# Patient Record
Sex: Female | Born: 1980 | Race: Black or African American | Hispanic: No | Marital: Single | Smoking: Never smoker
Health system: Southern US, Community
[De-identification: ages and names within clinical notes are randomized; demographics above are authoritative.]

## PROBLEM LIST (undated history)

## (undated) DIAGNOSIS — M1990 Unspecified osteoarthritis, unspecified site: Secondary | ICD-10-CM

## (undated) DIAGNOSIS — K219 Gastro-esophageal reflux disease without esophagitis: Secondary | ICD-10-CM

## (undated) DIAGNOSIS — R011 Cardiac murmur, unspecified: Secondary | ICD-10-CM

## (undated) DIAGNOSIS — R51 Headache: Secondary | ICD-10-CM

## (undated) HISTORY — PX: DILATION AND CURETTAGE OF UTERUS: SHX78

## (undated) HISTORY — PX: TUBAL LIGATION: SHX77

---

## 2012-03-21 ENCOUNTER — Encounter (HOSPITAL_COMMUNITY): Payer: Self-pay | Admitting: Pharmacy Technician

## 2012-03-21 ENCOUNTER — Other Ambulatory Visit (HOSPITAL_COMMUNITY): Payer: Self-pay | Admitting: Orthopedic Surgery

## 2012-03-21 ENCOUNTER — Encounter (HOSPITAL_COMMUNITY): Payer: Self-pay

## 2012-03-22 NOTE — H&P (Signed)
Theresa Powell is an 32 y.o. female.   Chief Complaint: Left ankle medial malleolar fracture. HPI: Patient jumped out of a tree sustained a medial malleolar fracture interarticular left ankle  Past Medical History  Diagnosis Date  . Heart murmur     "as a child"  . GERD (gastroesophageal reflux disease)   . Headache     "migraines"  . Arthritis     Past Surgical History  Procedure Laterality Date  . Dilation and curettage of uterus    . Tubal ligation      History reviewed. No pertinent family history. Social History:  reports that she has never smoked. She does not have any smokeless tobacco history on file. She reports that she does not use illicit drugs. Her alcohol history is not on file.  Allergies:  Allergies  Allergen Reactions  . Hydrocodone-Acetaminophen Nausea Only and Other (See Comments)    Made patient dizzy/light headed after taken.    No prescriptions prior to admission    No results found for this or any previous visit (from the past 48 hour(s)). No results found.  Review of Systems  All other systems reviewed and are negative.    Height 5\' 6"  (1.676 m), weight 79.379 kg (175 lb), last menstrual period 03/08/2012. Physical Exam  On examination patient has palpable pulses she is tender to palpation medially there is no skin abrasions or breakdown. Radiographs shows a displaced intra-articular medial malleolar fracture. Assessment/Plan Assessment intra-articular medial malleolar displaced fracture.  Plan: Will plan for internal fixation medial malleolar fracture. Risks and benefits were discussed including infection arthritis neurovascular injury nonhealing of the bone. Patient states he understands was pursued this time plan to followup 2 weeks in the office.  DUDA,MARCUS V 03/22/2012, 7:21 AM

## 2012-03-24 MED ORDER — CEFAZOLIN SODIUM-DEXTROSE 2-3 GM-% IV SOLR
2.0000 g | INTRAVENOUS | Status: AC
  Administered 2012-03-25: 2 g via INTRAVENOUS
  Filled 2012-03-24: qty 50

## 2012-03-25 ENCOUNTER — Ambulatory Visit (HOSPITAL_COMMUNITY)
Admission: RE | Admit: 2012-03-25 | Discharge: 2012-03-25 | Disposition: A | Discharge status: Home / Self Care | Payer: BC Managed Care – PPO | Source: Ambulatory Visit | Attending: Orthopedic Surgery | Admitting: Orthopedic Surgery

## 2012-03-25 ENCOUNTER — Encounter (HOSPITAL_COMMUNITY)
Admission: RE | Disposition: A | Discharge status: Home / Self Care | Payer: Self-pay | Source: Ambulatory Visit | Attending: Orthopedic Surgery

## 2012-03-25 ENCOUNTER — Encounter (HOSPITAL_COMMUNITY): Payer: Self-pay | Admitting: Anesthesiology

## 2012-03-25 ENCOUNTER — Ambulatory Visit (HOSPITAL_COMMUNITY): Payer: BC Managed Care – PPO | Admitting: Anesthesiology

## 2012-03-25 DIAGNOSIS — S8253XA Displaced fracture of medial malleolus of unspecified tibia, initial encounter for closed fracture: Secondary | ICD-10-CM | POA: Insufficient documentation

## 2012-03-25 DIAGNOSIS — S82872A Displaced pilon fracture of left tibia, initial encounter for closed fracture: Secondary | ICD-10-CM

## 2012-03-25 DIAGNOSIS — W1789XA Other fall from one level to another, initial encounter: Secondary | ICD-10-CM | POA: Insufficient documentation

## 2012-03-25 HISTORY — DX: Headache: R51

## 2012-03-25 HISTORY — DX: Gastro-esophageal reflux disease without esophagitis: K21.9

## 2012-03-25 HISTORY — PX: ORIF ANKLE FRACTURE: SHX5408

## 2012-03-25 HISTORY — DX: Cardiac murmur, unspecified: R01.1

## 2012-03-25 HISTORY — DX: Unspecified osteoarthritis, unspecified site: M19.90

## 2012-03-25 LAB — CBC
HCT: 40.4 % (ref 36.0–46.0)
Hemoglobin: 13.8 g/dL (ref 12.0–15.0)
MCV: 80.2 fL (ref 78.0–100.0)
RBC: 5.04 MIL/uL (ref 3.87–5.11)
WBC: 6.1 10*3/uL (ref 4.0–10.5)

## 2012-03-25 SURGERY — OPEN REDUCTION INTERNAL FIXATION (ORIF) ANKLE FRACTURE
Anesthesia: General | Site: Ankle | Laterality: Left | Wound class: Clean

## 2012-03-25 MED ORDER — HYDROMORPHONE HCL PF 1 MG/ML IJ SOLN
0.2500 mg | INTRAMUSCULAR | Status: DC | PRN
  Administered 2012-03-25 (×2): 0.5 mg via INTRAVENOUS

## 2012-03-25 MED ORDER — DIPHENHYDRAMINE HCL 50 MG/ML IJ SOLN
12.5000 mg | Freq: Once | INTRAMUSCULAR | Status: AC
  Administered 2012-03-25: 12.5 mg via INTRAVENOUS

## 2012-03-25 MED ORDER — DEXAMETHASONE SODIUM PHOSPHATE 4 MG/ML IJ SOLN
INTRAMUSCULAR | Status: DC | PRN
  Administered 2012-03-25: 4 mg via INTRAVENOUS

## 2012-03-25 MED ORDER — OXYCODONE HCL 5 MG/5ML PO SOLN: 5.0000 mg | Freq: Once | ORAL | Status: AC | PRN

## 2012-03-25 MED ORDER — TRAMADOL HCL 50 MG PO TABS: 50.0000 mg | ORAL_TABLET | Freq: Four times a day (QID) | ORAL | Status: DC | PRN

## 2012-03-25 MED ORDER — OXYCODONE HCL 5 MG PO TABS
ORAL_TABLET | ORAL | Status: AC
  Administered 2012-03-25: 5 mg via ORAL
  Filled 2012-03-25: qty 1

## 2012-03-25 MED ORDER — MUPIROCIN 2 % EX OINT: TOPICAL_OINTMENT | Freq: Two times a day (BID) | CUTANEOUS | Status: DC

## 2012-03-25 MED ORDER — LACTATED RINGERS IV SOLN
INTRAVENOUS | Status: DC | PRN
  Administered 2012-03-25: 17:00:00 via INTRAVENOUS

## 2012-03-25 MED ORDER — FENTANYL CITRATE 0.05 MG/ML IJ SOLN
INTRAMUSCULAR | Status: DC | PRN
  Administered 2012-03-25 (×5): 50 ug via INTRAVENOUS

## 2012-03-25 MED ORDER — 0.9 % SODIUM CHLORIDE (POUR BTL) OPTIME
TOPICAL | Status: DC | PRN
  Administered 2012-03-25: 1000 mL

## 2012-03-25 MED ORDER — MIDAZOLAM HCL 5 MG/5ML IJ SOLN
INTRAMUSCULAR | Status: DC | PRN
  Administered 2012-03-25: 2 mg via INTRAVENOUS

## 2012-03-25 MED ORDER — HYDROMORPHONE HCL PF 1 MG/ML IJ SOLN
INTRAMUSCULAR | Status: AC
  Administered 2012-03-25: 0.5 mg via INTRAVENOUS
  Filled 2012-03-25: qty 1

## 2012-03-25 MED ORDER — METOCLOPRAMIDE HCL 5 MG/ML IJ SOLN
INTRAMUSCULAR | Status: AC
  Administered 2012-03-25: 10 mg via INTRAVENOUS
  Filled 2012-03-25: qty 2

## 2012-03-25 MED ORDER — ACETAMINOPHEN 10 MG/ML IV SOLN
INTRAVENOUS | Status: DC | PRN
  Administered 2012-03-25: 1000 mg via INTRAVENOUS

## 2012-03-25 MED ORDER — DIPHENHYDRAMINE HCL 50 MG/ML IJ SOLN
INTRAMUSCULAR | Status: AC
  Filled 2012-03-25: qty 1

## 2012-03-25 MED ORDER — OXYCODONE HCL 5 MG PO TABS: 5.0000 mg | ORAL_TABLET | Freq: Once | ORAL | Status: AC | PRN

## 2012-03-25 MED ORDER — MUPIROCIN 2 % EX OINT
TOPICAL_OINTMENT | CUTANEOUS | Status: AC
  Administered 2012-03-25: 1 via NASAL
  Filled 2012-03-25: qty 22

## 2012-03-25 MED ORDER — PROPOFOL 10 MG/ML IV BOLUS
INTRAVENOUS | Status: DC | PRN
  Administered 2012-03-25: 200 mg via INTRAVENOUS

## 2012-03-25 MED ORDER — HYDROMORPHONE HCL PF 1 MG/ML IJ SOLN
INTRAMUSCULAR | Status: AC
  Filled 2012-03-25: qty 1

## 2012-03-25 MED ORDER — METOCLOPRAMIDE HCL 5 MG/ML IJ SOLN: 10.0000 mg | Freq: Once | INTRAMUSCULAR | Status: AC | PRN

## 2012-03-25 MED ORDER — LIDOCAINE HCL (CARDIAC) 20 MG/ML IV SOLN
INTRAVENOUS | Status: DC | PRN
  Administered 2012-03-25: 100 mg via INTRAVENOUS

## 2012-03-25 MED ORDER — ONDANSETRON HCL 4 MG/2ML IJ SOLN
INTRAMUSCULAR | Status: DC | PRN
  Administered 2012-03-25: 4 mg via INTRAVENOUS

## 2012-03-25 SURGICAL SUPPLY — 47 items
BANDAGE ESMARK 6X9 LF (GAUZE/BANDAGES/DRESSINGS) IMPLANT
BANDAGE GAUZE ELAST BULKY 4 IN (GAUZE/BANDAGES/DRESSINGS) ×2 IMPLANT
BIT DRILL 2.9 CANN QC NONSTRL (BIT) ×2 IMPLANT
BNDG COHESIVE 4X5 TAN STRL (GAUZE/BANDAGES/DRESSINGS) ×2 IMPLANT
BNDG COHESIVE 6X5 TAN STRL LF (GAUZE/BANDAGES/DRESSINGS) ×2 IMPLANT
BNDG ESMARK 6X9 LF (GAUZE/BANDAGES/DRESSINGS)
BNDG GAUZE STRTCH 6 (GAUZE/BANDAGES/DRESSINGS) ×2 IMPLANT
CLOTH BEACON ORANGE TIMEOUT ST (SAFETY) ×2 IMPLANT
COVER SURGICAL LIGHT HANDLE (MISCELLANEOUS) ×2 IMPLANT
CUFF TOURNIQUET SINGLE 34IN LL (TOURNIQUET CUFF) IMPLANT
CUFF TOURNIQUET SINGLE 44IN (TOURNIQUET CUFF) IMPLANT
DRAPE C-ARM MINI 42X72 WSTRAPS (DRAPES) ×2 IMPLANT
DRAPE INCISE IOBAN 66X45 STRL (DRAPES) IMPLANT
DRAPE PROXIMA HALF (DRAPES) ×2 IMPLANT
DRAPE U-SHAPE 47X51 STRL (DRAPES) ×2 IMPLANT
DRSG ADAPTIC 3X8 NADH LF (GAUZE/BANDAGES/DRESSINGS) ×2 IMPLANT
DURAPREP 26ML APPLICATOR (WOUND CARE) ×2 IMPLANT
ELECT REM PT RETURN 9FT ADLT (ELECTROSURGICAL) ×2
ELECTRODE REM PT RTRN 9FT ADLT (ELECTROSURGICAL) ×1 IMPLANT
GLOVE BIO SURGEON STRL SZ7.5 (GLOVE) ×2 IMPLANT
GLOVE BIOGEL PI IND STRL 9 (GLOVE) ×1 IMPLANT
GLOVE BIOGEL PI INDICATOR 9 (GLOVE) ×1
GLOVE SURG ORTHO 9.0 STRL STRW (GLOVE) ×2 IMPLANT
GOWN PREVENTION PLUS XLARGE (GOWN DISPOSABLE) ×2 IMPLANT
GOWN SRG XL XLNG 56XLVL 4 (GOWN DISPOSABLE) ×2 IMPLANT
GOWN STRL NON-REIN XL XLG LVL4 (GOWN DISPOSABLE) ×2
K-WIRE ACE 1.6X6 (WIRE) ×4
KIT BASIN OR (CUSTOM PROCEDURE TRAY) ×2 IMPLANT
KIT ROOM TURNOVER OR (KITS) ×2 IMPLANT
KWIRE ACE 1.6X6 (WIRE) ×2 IMPLANT
MANIFOLD NEPTUNE II (INSTRUMENTS) ×2 IMPLANT
NS IRRIG 1000ML POUR BTL (IV SOLUTION) ×2 IMPLANT
PACK ORTHO EXTREMITY (CUSTOM PROCEDURE TRAY) ×2 IMPLANT
PAD ARMBOARD 7.5X6 YLW CONV (MISCELLANEOUS) ×4 IMPLANT
PADDING CAST COTTON 6X4 STRL (CAST SUPPLIES) ×2 IMPLANT
SCREW ACE CAN 4.0 42M (Screw) ×4 IMPLANT
SPONGE GAUZE 4X4 12PLY (GAUZE/BANDAGES/DRESSINGS) ×2 IMPLANT
SPONGE LAP 18X18 X RAY DECT (DISPOSABLE) ×2 IMPLANT
STAPLER VISISTAT 35W (STAPLE) ×2 IMPLANT
SUCTION FRAZIER TIP 10 FR DISP (SUCTIONS) ×2 IMPLANT
SUT ETHILON 2 0 PSLX (SUTURE) IMPLANT
SUT VIC AB 2-0 CTB1 (SUTURE) ×6 IMPLANT
TAPE STRIPS DRAPE STRL (GAUZE/BANDAGES/DRESSINGS) ×2 IMPLANT
TOWEL OR 17X24 6PK STRL BLUE (TOWEL DISPOSABLE) ×2 IMPLANT
TOWEL OR 17X26 10 PK STRL BLUE (TOWEL DISPOSABLE) ×2 IMPLANT
TUBE CONNECTING 12X1/4 (SUCTIONS) ×2 IMPLANT
WATER STERILE IRR 1000ML POUR (IV SOLUTION) ×2 IMPLANT

## 2012-03-25 NOTE — Transfer of Care (Signed)
Immediate Anesthesia Transfer of Care Note  Patient: Theresa Powell  Procedure(s) Performed: Procedure(s) with comments: OPEN REDUCTION INTERNAL FIXATION (ORIF) ANKLE FRACTURE (Left) - Open Reduction Internal Fixation Left Ankle  Patient Location: PACU  Anesthesia Type:General  Level of Consciousness: awake, alert  and oriented  Airway & Oxygen Therapy: Patient Spontanous Breathing and Patient connected to nasal cannula oxygen  Post-op Assessment: Report given to PACU RN and Post -op Vital signs reviewed and stable  Post vital signs: Reviewed and stable  Complications: No apparent anesthesia complications

## 2012-03-25 NOTE — Preoperative (Signed)
Beta Blockers   Reason not to administer Beta Blockers:Not Applicable 

## 2012-03-25 NOTE — Progress Notes (Signed)
Orthopedic Tech Progress Note Patient Details:  Theresa Powell 31-Oct-1980 [id number]  Ortho Devices Type of Ortho Device: CAM walker Ortho Device/Splint Location: left foot Ortho Device/Splint Interventions: Application   Bear Osten 03/25/2012, 8:50 PM

## 2012-03-25 NOTE — Anesthesia Postprocedure Evaluation (Signed)
  Anesthesia Post-op Note  Patient: Theresa Powell  Procedure(s) Performed: Procedure(s) with comments: OPEN REDUCTION INTERNAL FIXATION (ORIF) ANKLE FRACTURE (Left) - Open Reduction Internal Fixation Left Ankle  Patient Location: PACU  Anesthesia Type:General  Level of Consciousness: awake, alert  and oriented  Airway and Oxygen Therapy: Patient Spontanous Breathing and Patient connected to nasal cannula oxygen  Post-op Pain: mild  Post-op Assessment: Post-op Vital signs reviewed and Patient's Cardiovascular Status Stable  Post-op Vital Signs: stable  Complications: No apparent anesthesia complications

## 2012-03-25 NOTE — Op Note (Signed)
OPERATIVE REPORT  DATE OF SURGERY: 03/25/2012  PATIENT:  Theresa Powell,  32 y.o. female  PRE-OPERATIVE DIAGNOSIS:  Intra-articular left medial malleolus pylon fracture  POST-OPERATIVE DIAGNOSIS:  Intra-articular left medial malleolus pylon fracture  PROCEDURE:  Procedure(s): OPEN REDUCTION INTERNAL FIXATION (ORIF) pylon ANKLE FRACTURE  SURGEON:  Surgeon(s): Nadara Mustard, MD  ANESTHESIA:   general  EBL:  Minimal ML  SPECIMEN:  No Specimen  TOURNIQUET:  * No tourniquets in log *  PROCEDURE DETAILS: Patient is a 32 year old woman who jumped out of a tree sustaining an intra-articular medial malleolar pilon fracture. Patient has an unstable fracture and presents at this time for open reduction internal fixation. Risks and benefits were discussed including infection neurovascular injury arthritis need for additional surgery. Patient states she understands and wished to proceed at this time. Description of procedure patient brought to the operating room and underwent a general anesthetic. After adequate levels and anesthesia were obtained patient's left lower extremity was prepped using DuraPrep and draped into a sterile field. An incision was made longitudinally over the medial malleolus. The fracture was freshened irrigated cleansed and reduced. This was stabilized with 2 K wires and then using the drill followed by the cannulated screws 2 cannulated screws 44 mm in length were used to stabilize the fracture. C-arm fluoroscopy was used to verify reduction. 2 view radiographs of the ankle shows a congruent mortise after reduction internal fixation. The wound was irrigated normal saline subcutaneous is closed using 2-0 Vicryl the skin was closed using staples. Wound was covered with Adaptic orthopedic sponges Kerlix and Coban. Patient was extubated taken to the PACU in stable condition plan for discharge to home.  PLAN OF CARE: Discharge to home after PACU  PATIENT DISPOSITION:  PACU -  hemodynamically stable.   Nadara Mustard, MD 03/25/2012 6:22 PM

## 2012-03-25 NOTE — H&P (Signed)
  Patient's procedure was canceled Friday do to snow and ice.. She presents at this time for open reduction internal fixation medial malleolar fracture. There is no interval change since her last history and physical on Friday.

## 2012-03-25 NOTE — Anesthesia Preprocedure Evaluation (Signed)
Anesthesia Evaluation  Patient identified by MRN, date of birth, ID band Patient awake    Reviewed: Allergy & Precautions, H&P , NPO status , Patient's Chart, lab work & pertinent test results, reviewed documented beta blocker date and time   Airway Mallampati: II TM Distance: >3 FB Neck ROM: full    Dental   Pulmonary neg pulmonary ROS,  breath sounds clear to auscultation        Cardiovascular negative cardio ROS  Rhythm:regular     Neuro/Psych  Headaches, negative psych ROS   GI/Hepatic negative GI ROS, Neg liver ROS, GERD-  Medicated and Controlled,  Endo/Other  negative endocrine ROS  Renal/GU negative Renal ROS  negative genitourinary   Musculoskeletal   Abdominal   Peds  Hematology negative hematology ROS (+)   Anesthesia Other Findings See surgeon's H&P   Reproductive/Obstetrics negative OB ROS                           Anesthesia Physical Anesthesia Plan  ASA: I  Anesthesia Plan: General   Post-op Pain Management:    Induction: Intravenous  Airway Management Planned: LMA  Additional Equipment:   Intra-op Plan:   Post-operative Plan:   Informed Consent: I have reviewed the patients History and Physical, chart, labs and discussed the procedure including the risks, benefits and alternatives for the proposed anesthesia with the patient or authorized representative who has indicated his/her understanding and acceptance.   Dental Advisory Given  Plan Discussed with: CRNA and Surgeon  Anesthesia Plan Comments:         Anesthesia Quick Evaluation

## 2012-03-25 NOTE — OR Nursing (Signed)
Placed lead apron around patient's abdomen prior to procedure.

## 2012-03-25 NOTE — Anesthesia Procedure Notes (Signed)
Procedure Name: LMA Insertion Date/Time: 03/25/2012 5:50 PM Performed by: Brien Mates DOBSON Pre-anesthesia Checklist: Patient identified, Emergency Drugs available, Suction available, Patient being monitored and Timeout performed Patient Re-evaluated:Patient Re-evaluated prior to inductionOxygen Delivery Method: Circle system utilized Preoxygenation: Pre-oxygenation with 100% oxygen Intubation Type: IV induction Ventilation: Mask ventilation without difficulty LMA: LMA inserted and LMA with gastric port inserted LMA Size: 4.0 Placement Confirmation: positive ETCO2 and breath sounds checked- equal and bilateral Tube secured with: Tape Dental Injury: Teeth and Oropharynx as per pre-operative assessment

## 2012-03-27 ENCOUNTER — Encounter (HOSPITAL_COMMUNITY): Payer: Self-pay | Admitting: Orthopedic Surgery

## 2014-02-23 DIAGNOSIS — F331 Major depressive disorder, recurrent, moderate: Secondary | ICD-10-CM | POA: Insufficient documentation

## 2014-02-23 DIAGNOSIS — G43009 Migraine without aura, not intractable, without status migrainosus: Secondary | ICD-10-CM | POA: Insufficient documentation

## 2017-02-22 ENCOUNTER — Other Ambulatory Visit (HOSPITAL_COMMUNITY): Payer: Self-pay | Admitting: Family Medicine

## 2017-02-23 ENCOUNTER — Other Ambulatory Visit: Payer: Self-pay | Admitting: Family Medicine

## 2017-02-23 DIAGNOSIS — R1084 Generalized abdominal pain: Secondary | ICD-10-CM

## 2017-02-23 DIAGNOSIS — R102 Pelvic and perineal pain: Secondary | ICD-10-CM

## 2017-02-27 ENCOUNTER — Ambulatory Visit
Admission: RE | Admit: 2017-02-27 | Discharge: 2017-02-27 | Disposition: A | Discharge status: Home / Self Care | Payer: BLUE CROSS/BLUE SHIELD | Source: Ambulatory Visit | Attending: Family Medicine | Admitting: Family Medicine

## 2017-02-27 ENCOUNTER — Other Ambulatory Visit: Payer: Self-pay | Admitting: Family Medicine

## 2017-02-27 DIAGNOSIS — R102 Pelvic and perineal pain: Secondary | ICD-10-CM

## 2017-02-27 DIAGNOSIS — R1084 Generalized abdominal pain: Secondary | ICD-10-CM | POA: Diagnosis not present

## 2017-03-12 ENCOUNTER — Other Ambulatory Visit: Payer: Self-pay | Admitting: Nurse Practitioner

## 2017-03-12 DIAGNOSIS — G932 Benign intracranial hypertension: Secondary | ICD-10-CM

## 2017-03-20 ENCOUNTER — Ambulatory Visit: Payer: BLUE CROSS/BLUE SHIELD

## 2017-03-21 ENCOUNTER — Ambulatory Visit
Admission: RE | Admit: 2017-03-21 | Discharge: 2017-03-21 | Disposition: A | Discharge status: Home / Self Care | Payer: BLUE CROSS/BLUE SHIELD | Source: Ambulatory Visit | Attending: Nurse Practitioner | Admitting: Nurse Practitioner

## 2017-03-21 DIAGNOSIS — G932 Benign intracranial hypertension: Secondary | ICD-10-CM

## 2017-03-21 DIAGNOSIS — R9089 Other abnormal findings on diagnostic imaging of central nervous system: Secondary | ICD-10-CM | POA: Insufficient documentation

## 2017-03-27 ENCOUNTER — Other Ambulatory Visit
Admission: RE | Admit: 2017-03-27 | Discharge: 2017-03-27 | Disposition: A | Discharge status: Home / Self Care | Payer: BLUE CROSS/BLUE SHIELD | Source: Other Acute Inpatient Hospital | Attending: Neurology | Admitting: Neurology

## 2017-03-27 DIAGNOSIS — R9089 Other abnormal findings on diagnostic imaging of central nervous system: Secondary | ICD-10-CM | POA: Diagnosis present

## 2017-03-27 LAB — APTT: aPTT: 33 seconds (ref 24–36)

## 2017-04-04 ENCOUNTER — Other Ambulatory Visit: Payer: Self-pay | Admitting: Neurology

## 2017-04-06 ENCOUNTER — Other Ambulatory Visit: Payer: Self-pay | Admitting: Neurology

## 2017-04-06 DIAGNOSIS — G932 Benign intracranial hypertension: Secondary | ICD-10-CM

## 2017-04-18 ENCOUNTER — Ambulatory Visit
Admission: RE | Admit: 2017-04-18 | Discharge: 2017-04-18 | Disposition: A | Discharge status: Home / Self Care | Payer: BLUE CROSS/BLUE SHIELD | Source: Ambulatory Visit | Attending: Neurology | Admitting: Neurology

## 2017-04-18 DIAGNOSIS — G43009 Migraine without aura, not intractable, without status migrainosus: Secondary | ICD-10-CM | POA: Insufficient documentation

## 2017-04-18 DIAGNOSIS — G932 Benign intracranial hypertension: Secondary | ICD-10-CM

## 2017-04-18 LAB — GLUCOSE, CSF: GLUCOSE CSF: 58 mg/dL (ref 40–70)

## 2017-04-18 LAB — PREGNANCY, URINE: Preg Test, Ur: NEGATIVE

## 2017-04-18 LAB — CSF CELL COUNT WITH DIFFERENTIAL
EOS CSF: 0 %
Lymphs, CSF: 91 %
Monocyte-Macrophage-Spinal Fluid: 0 %
Other Cells, CSF: 0
RBC Count, CSF: 481 /mm3 — ABNORMAL HIGH (ref 0–3)
Segmented Neutrophils-CSF: 9 %
TUBE #: 3
WBC, CSF: 13 /mm3 (ref 0–5)

## 2017-04-18 LAB — GLUCOSE, RANDOM: Glucose, Bld: 101 mg/dL — ABNORMAL HIGH (ref 65–99)

## 2017-04-18 LAB — PROTEIN, CSF: TOTAL PROTEIN, CSF: 26 mg/dL (ref 15–45)

## 2017-04-18 MED ORDER — ACETAMINOPHEN 500 MG PO TABS: 1000.0000 mg | ORAL_TABLET | Freq: Four times a day (QID) | ORAL | Status: DC | PRN

## 2017-04-18 NOTE — Progress Notes (Signed)
CSF white count 13 Dr. SwazilandJordan notified. No orders received.

## 2017-04-18 NOTE — Discharge Instructions (Signed)
Lumbar Puncture, Care After °Refer to this sheet in the next few weeks. These instructions provide you with information on caring for yourself after your procedure. Your health care provider may also give you more specific instructions. Your treatment has been planned according to current medical practices, but problems sometimes occur. Call your health care provider if you have any problems or questions after your procedure. °What can I expect after the procedure? °After your procedure, it is typical to have the following sensations: °· Mild discomfort or pain at the insertion site. °· Mild headache that is relieved with pain medicines. ° °Follow these instructions at home: ° °· Avoid lifting anything heavier than 10 lb (4.5 kg) for at least 12 hours after the procedure. °· Drink enough fluids to keep your urine clear or pale yellow. °Contact a health care provider if: °· You have fever or chills. °· You have nausea or vomiting. °· You have a headache that lasts for more than 2 days. °Get help right away if: °· You have any numbness or tingling in your legs. °· You are unable to control your bowel or bladder. °· You have bleeding or swelling in your back at the insertion site. °· You are dizzy or faint. °This information is not intended to replace advice given to you by your health care provider. Make sure you discuss any questions you have with your health care provider. °Document Released: 01/07/2013 Document Revised: 06/10/2015 Document Reviewed: 09/10/2012 °Elsevier Interactive Patient Education © 2017 [employer] ° °

## 2017-04-19 LAB — IGG CSF INDEX
ALBUMIN CSF-MCNC: 14 mg/dL (ref 11–48)
ALBUMIN: 4.7 g/dL (ref 3.5–5.5)
CSF IgG Index: 0.5 (ref 0.0–0.7)
IGG (IMMUNOGLOBIN G), SERUM: 1598 mg/dL (ref 700–1600)
IgG, CSF: 2.2 mg/dL (ref 0.0–8.6)
IgG/Alb Ratio, CSF: 0.16 (ref 0.00–0.25)

## 2017-04-19 LAB — IGG 4: IgG, Subclass 4: 47 mg/dL (ref 2–96)

## 2017-04-20 LAB — OLIGOCLONAL BANDS, CSF + SERM

## 2017-10-01 ENCOUNTER — Other Ambulatory Visit: Payer: Self-pay

## 2017-10-01 ENCOUNTER — Emergency Department: Payer: BLUE CROSS/BLUE SHIELD

## 2017-10-01 ENCOUNTER — Inpatient Hospital Stay
Admission: EM | Admit: 2017-10-01 | Discharge: 2017-10-12 | DRG: 165 | Disposition: A | Discharge status: Home / Self Care | Payer: BLUE CROSS/BLUE SHIELD | Attending: Cardiothoracic Surgery | Admitting: Cardiothoracic Surgery

## 2017-10-01 ENCOUNTER — Encounter: Payer: Self-pay | Admitting: Emergency Medicine

## 2017-10-01 ENCOUNTER — Observation Stay: Payer: BLUE CROSS/BLUE SHIELD

## 2017-10-01 DIAGNOSIS — M1990 Unspecified osteoarthritis, unspecified site: Secondary | ICD-10-CM | POA: Diagnosis present

## 2017-10-01 DIAGNOSIS — X58XXXA Exposure to other specified factors, initial encounter: Secondary | ICD-10-CM | POA: Diagnosis not present

## 2017-10-01 DIAGNOSIS — M79605 Pain in left leg: Secondary | ICD-10-CM | POA: Diagnosis not present

## 2017-10-01 DIAGNOSIS — R110 Nausea: Secondary | ICD-10-CM | POA: Diagnosis not present

## 2017-10-01 DIAGNOSIS — K219 Gastro-esophageal reflux disease without esophagitis: Secondary | ICD-10-CM | POA: Diagnosis present

## 2017-10-01 DIAGNOSIS — R0602 Shortness of breath: Secondary | ICD-10-CM | POA: Diagnosis not present

## 2017-10-01 DIAGNOSIS — J930 Spontaneous tension pneumothorax: Secondary | ICD-10-CM

## 2017-10-01 DIAGNOSIS — J939 Pneumothorax, unspecified: Secondary | ICD-10-CM | POA: Diagnosis present

## 2017-10-01 DIAGNOSIS — M79604 Pain in right leg: Secondary | ICD-10-CM

## 2017-10-01 DIAGNOSIS — J438 Other emphysema: Secondary | ICD-10-CM | POA: Diagnosis present

## 2017-10-01 DIAGNOSIS — T50995A Adverse effect of other drugs, medicaments and biological substances, initial encounter: Secondary | ICD-10-CM | POA: Diagnosis not present

## 2017-10-01 DIAGNOSIS — Z9851 Tubal ligation status: Secondary | ICD-10-CM

## 2017-10-01 DIAGNOSIS — J9382 Other air leak: Secondary | ICD-10-CM | POA: Diagnosis not present

## 2017-10-01 DIAGNOSIS — Z09 Encounter for follow-up examination after completed treatment for conditions other than malignant neoplasm: Secondary | ICD-10-CM

## 2017-10-01 LAB — CBC
HCT: 40.1 % (ref 35.0–47.0)
HEMOGLOBIN: 13.6 g/dL (ref 12.0–16.0)
MCH: 26.9 pg (ref 26.0–34.0)
MCHC: 33.8 g/dL (ref 32.0–36.0)
MCV: 79.6 fL — ABNORMAL LOW (ref 80.0–100.0)
Platelets: 248 10*3/uL (ref 150–440)
RBC: 5.03 MIL/uL (ref 3.80–5.20)
RDW: 13.7 % (ref 11.5–14.5)
WBC: 5.7 10*3/uL (ref 3.6–11.0)

## 2017-10-01 LAB — PREGNANCY, URINE: Preg Test, Ur: NEGATIVE

## 2017-10-01 LAB — CREATININE, SERUM
Creatinine, Ser: 1.02 mg/dL — ABNORMAL HIGH (ref 0.44–1.00)
GFR calc Af Amer: >60 mL/min (ref >60)
GFR calc non Af Amer: >60 mL/min (ref >60)

## 2017-10-01 LAB — TROPONIN I: Troponin I: <0.03 ng/mL (ref <0.03)

## 2017-10-01 MED ORDER — ONDANSETRON 4 MG PO TBDP: 4.0000 mg | ORAL_TABLET | Freq: Four times a day (QID) | ORAL | Status: DC | PRN

## 2017-10-01 MED ORDER — KETOROLAC TROMETHAMINE 30 MG/ML IJ SOLN
30.0000 mg | Freq: Four times a day (QID) | INTRAMUSCULAR | Status: DC | PRN
  Administered 2017-10-07: 30 mg via INTRAVENOUS
  Filled 2017-10-01: qty 1

## 2017-10-01 MED ORDER — HYDROMORPHONE HCL 1 MG/ML IJ SOLN
0.5000 mg | INTRAMUSCULAR | Status: DC | PRN
  Administered 2017-10-01 – 2017-10-03 (×4): 0.5 mg via INTRAVENOUS
  Filled 2017-10-01 (×4): qty 0.5

## 2017-10-01 MED ORDER — HYDROMORPHONE HCL 1 MG/ML IJ SOLN
1.0000 mg | INTRAMUSCULAR | Status: AC
  Administered 2017-10-01: 1 mg via INTRAVENOUS

## 2017-10-01 MED ORDER — LIDOCAINE-EPINEPHRINE (PF) 2 %-1:200000 IJ SOLN
20.0000 mL | Freq: Once | INTRAMUSCULAR | Status: AC
  Administered 2017-10-01: 20 mL
  Filled 2017-10-01 (×2): qty 20

## 2017-10-01 MED ORDER — FENTANYL CITRATE (PF) 100 MCG/2ML IJ SOLN
50.0000 ug | Freq: Once | INTRAMUSCULAR | Status: AC
  Administered 2017-10-01: 50 ug via INTRAVENOUS
  Filled 2017-10-01: qty 2

## 2017-10-01 MED ORDER — ENOXAPARIN SODIUM 40 MG/0.4ML ~~LOC~~ SOLN
40.0000 mg | SUBCUTANEOUS | Status: DC
  Administered 2017-10-01 – 2017-10-07 (×7): 40 mg via SUBCUTANEOUS
  Filled 2017-10-01 (×7): qty 0.4

## 2017-10-01 MED ORDER — HYDROMORPHONE HCL 1 MG/ML IJ SOLN
INTRAMUSCULAR | Status: AC
  Filled 2017-10-01: qty 1

## 2017-10-01 MED ORDER — KETOROLAC TROMETHAMINE 30 MG/ML IJ SOLN
30.0000 mg | Freq: Four times a day (QID) | INTRAMUSCULAR | Status: AC
  Administered 2017-10-01 – 2017-10-06 (×19): 30 mg via INTRAVENOUS
  Filled 2017-10-01 (×19): qty 1

## 2017-10-01 MED ORDER — OXYCODONE HCL 5 MG PO TABS
5.0000 mg | ORAL_TABLET | ORAL | Status: DC | PRN
  Administered 2017-10-02: 5 mg via ORAL
  Administered 2017-10-02: 10 mg via ORAL
  Administered 2017-10-02: 5 mg via ORAL
  Administered 2017-10-03 – 2017-10-06 (×6): 10 mg via ORAL
  Administered 2017-10-07: 5 mg via ORAL
  Filled 2017-10-01 (×4): qty 2
  Filled 2017-10-01 (×2): qty 1
  Filled 2017-10-01: qty 2
  Filled 2017-10-01: qty 1
  Filled 2017-10-01 (×2): qty 2

## 2017-10-01 MED ORDER — PANTOPRAZOLE SODIUM 40 MG PO TBEC
40.0000 mg | DELAYED_RELEASE_TABLET | Freq: Every day | ORAL | Status: DC
  Administered 2017-10-01 – 2017-10-08 (×8): 40 mg via ORAL
  Filled 2017-10-01 (×9): qty 1

## 2017-10-01 MED ORDER — ORAL CARE MOUTH RINSE
15.0000 mL | Freq: Two times a day (BID) | OROMUCOSAL | Status: DC
  Administered 2017-10-01 – 2017-10-02 (×2): 15 mL via OROMUCOSAL

## 2017-10-01 MED ORDER — SODIUM CHLORIDE 0.9 % IV SOLN
INTRAVENOUS | Status: DC | PRN
  Administered 2017-10-01: 250 mL via INTRAVENOUS

## 2017-10-01 MED ORDER — ONDANSETRON HCL 4 MG/2ML IJ SOLN
4.0000 mg | Freq: Four times a day (QID) | INTRAMUSCULAR | Status: DC | PRN
  Administered 2017-10-01 – 2017-10-08 (×2): 4 mg via INTRAVENOUS
  Filled 2017-10-01 (×3): qty 2

## 2017-10-01 MED ORDER — MORPHINE SULFATE (PF) 2 MG/ML IV SOLN: 2.0000 mg | INTRAVENOUS | Status: DC | PRN

## 2017-10-01 MED ORDER — ACETAMINOPHEN 500 MG PO TABS
1000.0000 mg | ORAL_TABLET | Freq: Four times a day (QID) | ORAL | Status: DC
  Administered 2017-10-01 – 2017-10-07 (×21): 1000 mg via ORAL
  Filled 2017-10-01 (×23): qty 2

## 2017-10-01 MED ORDER — OXYCODONE-ACETAMINOPHEN 5-325 MG PO TABS: 1.0000 | ORAL_TABLET | Freq: Four times a day (QID) | ORAL | Status: DC | PRN

## 2017-10-01 MED ORDER — LORAZEPAM 2 MG/ML IJ SOLN
1.0000 mg | Freq: Once | INTRAMUSCULAR | Status: AC
  Administered 2017-10-01: 1 mg via INTRAVENOUS
  Filled 2017-10-01: qty 1

## 2017-10-01 MED ORDER — PROMETHAZINE HCL 25 MG/ML IJ SOLN
12.5000 mg | Freq: Four times a day (QID) | INTRAMUSCULAR | Status: DC | PRN
  Administered 2017-10-02: 12.5 mg via INTRAVENOUS
  Filled 2017-10-01 (×3): qty 1

## 2017-10-01 MED ORDER — ONDANSETRON HCL 4 MG/2ML IJ SOLN
4.0000 mg | Freq: Once | INTRAMUSCULAR | Status: AC
  Administered 2017-10-01: 4 mg via INTRAVENOUS
  Filled 2017-10-01: qty 2

## 2017-10-01 NOTE — ED Triage Notes (Signed)
States was [place] ride at fair [place] days ago at [place] and had onset of SOB and chest pain. [facility] by PA who states he did chest xray and patient appears to have R pneumothorax.

## 2017-10-01 NOTE — H&P (Signed)
[facility] Admission Note  Theresa Powell 02/19/1980  [id number].    Requesting MD: Dr. Sharman CheekPhillip Stafford, MD (Emergency Medicine)  Chief Complaint/Reason for Consult: SOB, Chest Pain  HPI:  Theresa Powell is a 37 y.o. female who presents to the ED with chest pain and shortness of breath. She notes that on Saturday she was at a fair and on a "zero-ravity" ride when she felt a sudden onset of sharp chest pain in the right center of her chest and shortness of breath. The pain lasted for a few minutes and gradually improved. Yesterday, she was noticing intermittent sharp pain in the right side of her chest worse with laying down. She went to her PCP this morning for this who preformed a CXR and was found to have a PTX and sent her to the ED. She continues to notice mild SOB and CP. She denied any recent trauma. She denied any additional symptoms and this has never happened to her in the past. Non-smoker.    ROS: Review of Systems  Constitutional: Negative for chills and fever.  HENT: Negative for congestion.   Respiratory: Positive for cough and shortness of breath.   Cardiovascular: Positive for chest pain. Negative for palpitations.  Gastrointestinal: Negative for abdominal pain, blood in stool, diarrhea, nausea and vomiting.  Genitourinary: Negative for dysuria and hematuria.  All other systems reviewed and are negative.   No family history on file.  Past Medical History:  Diagnosis Date  . Arthritis   . GERD (gastroesophageal reflux disease)   . Headache(784.0)    "migraines"  . Heart murmur    "as a child"    Past Surgical History:  Procedure Laterality Date  . DILATION AND CURETTAGE OF UTERUS    . ORIF ANKLE FRACTURE Left 03/25/2012   Procedure: OPEN REDUCTION INTERNAL FIXATION (ORIF) ANKLE FRACTURE;  Surgeon: Nadara MustardMarcus V Duda, MD;  Location: [facility] OR;  Service: Orthopedics;  Laterality: Left;  Open Reduction Internal Fixation Left Ankle  . TUBAL LIGATION       Social History:  reports that she has never smoked. She does not have any smokeless tobacco history on file. She reports that she does not use drugs. Her alcohol history is not on file.  Allergies:  Allergies  Allergen Reactions  . Hydrocodone-Acetaminophen Nausea Only and Other (See Comments)    Made patient dizzy/light headed after taken.     (Not in a hospital admission)  Blood pressure (!) 129/96, pulse 82, temperature 98.4 F (36.9 C), temperature source Oral, resp. rate (!) 23, height 5\' 6"  (1.676 m), weight 88.5 kg, last menstrual period 09/17/2017, SpO2 92 %.   Physical Exam: Physical Exam  Constitutional: She is oriented to person, place, and time. She appears well-developed and well-nourished.  Non-toxic appearance. She does not appear ill.  HENT:  Head: Normocephalic and atraumatic.  Eyes: Pupils are equal, round, and reactive to light. EOM are normal. No scleral icterus.  Neck: Normal range of motion. Neck supple.  Cardiovascular: Normal rate, regular rhythm and normal heart sounds. Exam reveals no gallop and no friction rub.  No murmur heard. Pulmonary/Chest: Effort normal. No accessory muscle usage. No tachypnea. No respiratory distress. She has decreased breath sounds in the right upper field, the right middle field and the right lower field. She has no wheezes. She has no rhonchi. She has no rales. She exhibits no tenderness, no crepitus, no deformity and no swelling.  Abdominal: Soft. Bowel sounds are normal. She exhibits no distension. There is  no tenderness. There is no guarding.  Musculoskeletal:       Right lower leg: She exhibits no tenderness and no edema.       Left lower leg: She exhibits no tenderness and no edema.  Neurological: She is alert and oriented to person, place, and time. No cranial nerve deficit.  Skin: Skin is warm and dry. Capillary refill takes less than 2 seconds. No erythema.  Psychiatric: She has a normal mood and affect.    Results  for orders placed or performed during the hospital encounter of 10/01/17 (from the past 48 hour(s))  CBC     Status: Abnormal   Collection Time: 10/01/17 10:27 AM  Result Value Ref Range   WBC 5.7 3.6 - 11.0 K/uL   RBC 5.03 3.80 - 5.20 MIL/uL   Hemoglobin 13.6 12.0 - 16.0 g/dL   HCT 16.1 09.6 - 04.5 %   MCV 79.6 (L) 80.0 - 100.0 fL   MCH 26.9 26.0 - 34.0 pg   MCHC 33.8 32.0 - 36.0 g/dL   RDW 40.9 81.1 - 91.4 %   Platelets 248 150 - 440 K/uL    Comment: Performed at [facility], [address] [place], [place] [address]  Troponin I     Status: None   Collection Time: 10/01/17 10:27 AM  Result Value Ref Range   Troponin I <0.03 <0.03 ng/mL    Comment: Performed at [facility], [address] [place], [place] [address]   Dg Chest 2 View  Result Date: 10/01/2017 CLINICAL DATA:  Right-sided chest pain and shortness of breath since yesterday after getting off a ride at the [place]. Nonsmoker. EXAM: CHEST - 2 VIEW COMPARISON:  Report of a chest x-ray dated August 14, 2017 FINDINGS: There is a near-total pneumothorax on the right. There is mild mediastinal shift to the left. The left lung is well-expanded but exhibits mildly increased interstitial markings. The heart and pulmonary vascularity are normal. The trachea is midline. The bony thorax exhibits no acute abnormality. IMPRESSION: Near-total right-sided pneumothorax with mild shift of the mediastinum toward the left. Possible underlying interstitial disease on the left and likely in the collapsed right lung. Critical Value/emergent results were called by telephone at the time of interpretation on 10/01/2017 at 10:52 am to Dr. Sharman Cheek , who verbally acknowledged these results. Electronically Signed   By: David  [place] M.D.   On: 10/01/2017 10:55      Assessment/Plan  Acute Right Pneumothorax with mild tension component 1.  Admit to general surgery. Emergency MD will place chest tube in the ED, we appreciate their  help  2. Place Chest tube to 20cm wall suction, repeat XR after placement, CXR in the AM. Continue to monitor.  3. SCD's and lovenox for DVT proph 4. Encourage IS use  5. Regular Diet   Lynden Oxford, PA-C [facility] 10/01/2017, 12:26 PM [phone] M-F: 7am - 4pm

## 2017-10-01 NOTE — ED Notes (Signed)
Ed provider at bedside to insert chest tube.

## 2017-10-01 NOTE — ED Provider Notes (Signed)
[facility] Emergency Department Provider Note  ____________________________________________  Time seen: Approximately 12:47 PM  I have reviewed the triage vital signs and the nursing notes.   HISTORY  Chief Complaint Shortness of Breath    HPI Theresa Powell is a 37 y.o. female with no significant past medical history who complains of sudden onset of severe chest pain shortness of breath that [place] 2 days ago [place]  She was on a spinning ride with high centripetal force when the pain started.  It is been sudden severe constant, no aggravating or alleviating factors. pain is in the right chest radiating to the back   She initially went to outpatient clinic who diagnosed her with pneumothorax and sent her to the ED for management.   Past Medical History:  Diagnosis Date  . Arthritis   . GERD (gastroesophageal reflux disease)   . Headache(784.0)    "migraines"  . Heart murmur    "as a child"     Patient Active Problem List   Diagnosis Date Noted  . Pneumothorax on right 10/01/2017     Past Surgical History:  Procedure Laterality Date  . DILATION AND CURETTAGE OF UTERUS    . ORIF ANKLE FRACTURE Left 03/25/2012   Procedure: OPEN REDUCTION INTERNAL FIXATION (ORIF) ANKLE FRACTURE;  Surgeon: Newt Minion, MD;  Location: [facility];  Service: Orthopedics;  Laterality: Left;  Open Reduction Internal Fixation Left Ankle  . TUBAL LIGATION       Prior to Admission medications   Medication Sig Start Date End Date Taking? Authorizing Provider  ergocalciferol (VITAMIN D2) 50000 units capsule Take 50,000 Units by mouth once a week.   Yes [provider]  acetaminophen (TYLENOL) 500 MG tablet Take 500 mg by mouth every 6 (six) hours as needed.    [provider]  traMADol (ULTRAM) 50 MG tablet Take 1 tablet (50 mg total) by mouth every 6 (six) hours as needed for pain. Maximum dose= 8 tablets per day Patient not taking:  Reported on 10/01/2017 03/25/12   Newt Minion, MD     Allergies Hydrocodone-acetaminophen   No family history on file.  Social History Social History   Tobacco Use  . Smoking status: Never Smoker  Substance Use Topics  . Alcohol use: Not on file    Comment: "social"  . Drug use: No    Review of Systems  Constitutional:   No fever or chills.  ENT:   No sore throat. No rhinorrhea. Cardiovascular: Positive chest pain as above without syncope. Respiratory:   Positive shortness of breath without cough. Gastrointestinal:   Negative for abdominal pain, vomiting and diarrhea.  Musculoskeletal:   Negative for focal pain or swelling All other systems reviewed and are negative except as documented above in ROS and HPI.  ____________________________________________   PHYSICAL EXAM:  VITAL SIGNS: ED Triage Vitals [10/01/17 1006]  Enc Vitals Group     BP (!) 137/98     Pulse Rate 78     Resp 20     Temp 98.4 F (36.9 C)     Temp Source Oral     SpO2 98 %     Weight 195 lb (88.5 kg)     Height 5' 6"  (1.676 m)     Head Circumference      Peak Flow      Pain Score 7     Pain Loc      Pain Edu?  Excl. in [place]?     Vital signs reviewed, nursing assessments reviewed.   Constitutional:   Alert and oriented. Non-toxic appearance. Eyes:   Conjunctivae are normal. EOMI. ENT      Head:   Normocephalic and atraumatic.      Nose:   No congestion/rhinnorhea.       Mouth/Throat:   MMM, no pharyngeal erythema. No peritonsillar mass.       Neck:   No meningismus. Full ROM.  Trachea midline.  No crepitus Hematological/Lymphatic/Immunilogical:   No cervical lymphadenopathy. Cardiovascular:   RRR. Symmetric bilateral radial and DP pulses.  No murmurs. Cap refill less than 2 seconds. Respiratory:   Tachypnea.  Normal breath sounds on the left without wheezing or crackles.  Completely absent breath sounds on the right. Gastrointestinal:   Soft and nontender. Non distended. There  is no CVA tenderness.  No rebound, rigidity, or guarding. Musculoskeletal:   Normal range of motion in all extremities. No joint effusions.  No lower extremity tenderness.  No edema. Neurologic:   Normal speech and language.  Motor grossly intact. No acute focal neurologic deficits are appreciated.  Skin:    Skin is warm, dry and intact. No rash noted.  No petechiae, purpura, or bullae.  ____________________________________________    LABS (pertinent positives/negatives) (all labs ordered are listed, but only abnormal results are displayed) Labs Reviewed  CBC - Abnormal; Notable for the following components:      Result Value   MCV 79.6 (*)    All other components within normal limits  TROPONIN I  POC URINE PREG, ED   ____________________________________________   EKG  Interpreted by me  Date: 10/01/2017  Rate: 76  Rhythm: normal sinus rhythm  QRS Axis: normal  Intervals: normal  ST/T Wave abnormalities: normal  Conduction Disutrbances: none  Narrative Interpretation: unremarkable      ____________________________________________    RADIOLOGY  Dg Chest 2 View  Result Date: 10/01/2017 CLINICAL DATA:  Right-sided chest pain and shortness of breath since yesterday [place] Nonsmoker. EXAM: CHEST - 2 VIEW COMPARISON:  Report of a chest x-ray dated August 14, 2017 FINDINGS: There is a near-total pneumothorax on the right. There is mild mediastinal shift to the left. The left lung is well-expanded but exhibits mildly increased interstitial markings. The heart and pulmonary vascularity are normal. The trachea is midline. The bony thorax exhibits no acute abnormality. IMPRESSION: Near-total right-sided pneumothorax with mild shift of the mediastinum toward the left. Possible underlying interstitial disease on the left and likely in the collapsed right lung. Critical Value/emergent results were called by telephone at the time of interpretation on  10/01/2017 at 10:52 am to Dr. Carrie Mew , who verbally acknowledged these results. Electronically Signed   By: David  Martinique M.D.   On: 10/01/2017 10:55    ____________________________________________   PROCEDURES .Critical Care Performed by: Carrie Mew, MD Authorized by: Carrie Mew, MD   Critical care provider statement:    Critical care time (minutes):  30   Critical care time was exclusive of:  Separately billable procedures and treating other patients   Critical care was necessary to treat or prevent imminent or life-threatening deterioration of the following conditions:  Circulatory failure and respiratory failure   Critical care was time spent personally by me on the following activities:  Development of treatment plan with patient or surrogate, discussions with consultants, evaluation of patient's response to treatment, examination of patient, obtaining history from patient or surrogate,  ordering and performing treatments and interventions, ordering and review of laboratory studies, ordering and review of radiographic studies, pulse oximetry, re-evaluation of patient's condition and review of old charts CHEST TUBE INSERTION Date/Time: 10/01/2017 12:52 PM Performed by: Carrie Mew, MD Authorized by: Carrie Mew, MD   Consent:    Consent obtained:  Written   Consent given by:  Patient   Risks discussed:  Bleeding, nerve damage, infection, pain, damage to surrounding structures and incomplete drainage   Alternatives discussed:  No treatment Pre-procedure details:    Skin preparation:  ChloraPrep   Preparation: Patient was prepped and draped in the usual sterile fashion   Anesthesia (see MAR for exact dosages):    Anesthesia method:  Local infiltration   Local anesthetic:  Lidocaine 2% WITH epi Procedure details:    Placement location:  R lateral   Scalpel size:  11   Tube size (French): 14.   Dissection instrument: seldinger kit with guide wire,  introducer sheath, pigtail catheter.   Ultrasound guidance: no     Tension pneumothorax: yes     Tube connected to:  Water seal   Drainage characteristics:  Air only   Suture material:  0 silk   Dressing:  4x4 sterile gauze Post-procedure details:    Patient tolerance of procedure:  Tolerated well, no immediate complications    ____________________________________________    CLINICAL IMPRESSION / ASSESSMENT AND PLAN / ED COURSE  Pertinent labs & imaging results that were available during my care of the patient were reviewed by me and considered in my medical decision making (see chart for details).    Patient presents with chest pain shortness of breath, outpatient chest x-ray reported to reveal a pneumothorax.  Unable to access this image, so chest x-ray to be repeated in the ED.  Oxygenation about 95% on room air.  Clinical Course as of Oct 02 1427  Mon Oct 01, 2017  1101 100% ptx on my review of cxr image. D/w radiology who confirms and suspects some underlying interstitial disease. Recommended to pt she f/u with pulm after this acute ptx is resolved.  Surg. Paged regarding pleural cath placement   [PS]  1316 Repeat chest xray without evidence of complication. Pigtail catheter in center right chest.   [PS]    Clinical Course User Index [PS] Carrie Mew, MD     ----------------------------------------- 12:50 PM on 10/01/2017 -----------------------------------------  Radiology report did note some mediastinal shift indicative of early tension pneumothorax evolving.  Patient was placed on 100% nonrebreather.  Surgery came to see the patient to admit, requested that chest to be placed by me, patient consented, procedure completed sterilely without complications, EBL 0.  Tolerated well, shortness of breath improved after procedure completed.  Will obtain a repeat chest x-ray after which patient can be transported to the floor.  Blood pressure remained  stable.  ____________________________________________   FINAL CLINICAL IMPRESSION(S) / ED DIAGNOSES    Final diagnoses:  Tension pneumothorax, spontaneous  Shortness of breath     ED Discharge Orders    None      Portions of this note were generated with dragon dictation software. Dictation errors may occur despite best attempts at proofreading.    Carrie Mew, MD 10/01/17 [phone]

## 2017-10-02 ENCOUNTER — Observation Stay: Payer: BLUE CROSS/BLUE SHIELD

## 2017-10-02 DIAGNOSIS — J939 Pneumothorax, unspecified: Secondary | ICD-10-CM | POA: Diagnosis present

## 2017-10-02 DIAGNOSIS — X58XXXA Exposure to other specified factors, initial encounter: Secondary | ICD-10-CM | POA: Diagnosis not present

## 2017-10-02 DIAGNOSIS — R0602 Shortness of breath: Secondary | ICD-10-CM | POA: Diagnosis present

## 2017-10-02 DIAGNOSIS — T50995A Adverse effect of other drugs, medicaments and biological substances, initial encounter: Secondary | ICD-10-CM | POA: Diagnosis not present

## 2017-10-02 DIAGNOSIS — M79604 Pain in right leg: Secondary | ICD-10-CM | POA: Diagnosis not present

## 2017-10-02 DIAGNOSIS — J438 Other emphysema: Secondary | ICD-10-CM | POA: Diagnosis present

## 2017-10-02 DIAGNOSIS — M79605 Pain in left leg: Secondary | ICD-10-CM | POA: Diagnosis not present

## 2017-10-02 DIAGNOSIS — J9382 Other air leak: Secondary | ICD-10-CM | POA: Diagnosis not present

## 2017-10-02 DIAGNOSIS — J930 Spontaneous tension pneumothorax: Secondary | ICD-10-CM | POA: Diagnosis present

## 2017-10-02 DIAGNOSIS — K219 Gastro-esophageal reflux disease without esophagitis: Secondary | ICD-10-CM | POA: Diagnosis present

## 2017-10-02 DIAGNOSIS — Z9851 Tubal ligation status: Secondary | ICD-10-CM | POA: Diagnosis not present

## 2017-10-02 DIAGNOSIS — M1990 Unspecified osteoarthritis, unspecified site: Secondary | ICD-10-CM | POA: Diagnosis present

## 2017-10-02 DIAGNOSIS — R110 Nausea: Secondary | ICD-10-CM | POA: Diagnosis not present

## 2017-10-02 NOTE — Progress Notes (Signed)
[facility] Progress Note     Subjective: She noted improvement in her pain and breathing this morning. She does endorse a sharp intermittent pain in her right chest. Nausea associated with pain medications improved with current anti-emetic regimen. No abdominal pain or emesis. Tolerating a diet.   Objective: Vital signs in last 24 hours: Temp:  [97.9 F (36.6 C)-98.6 F (37 C)] 97.9 F (36.6 C) (09/17 0443) Pulse Rate:  [67-88] 67 (09/17 0443) Resp:  [17-24] 20 (09/17 0443) BP: (103-140)/(71-103) 107/79 (09/17 0443) SpO2:  [90 %-100 %] 100 % (09/17 0443) Weight:  [88.5 kg] 88.5 kg (09/16 1006) Last BM Date: 10/01/17  Intake/Output from previous day: 09/16 0701 - 09/17 0700 In: 133.1 [P.O.:120; I.V.:13.1] Out: 617 [Urine:600; Chest Tube:17] Intake/Output this shift: No intake/output data recorded.  PE: Gen:  Alert, NAD, pleasant Card:  Regular rate and rhythm, pedal pulses 2+ BL Pulm:  Normal effort, decreased breath sounds on the right Abd: Soft, non-tender, non-distended Skin: warm and dry, no rashes  Psych: A&Ox3   Lab Results:  Recent Labs    10/01/17 1027  WBC 5.7  HGB 13.6  HCT 40.1  PLT 248   BMET Recent Labs    10/01/17 1027  CREATININE 1.02*   PT/INR No results for input(s): LABPROT, INR in the last 72 hours. CMP     Component Value Date/Time   GLUCOSE 101 (H) 04/18/2017 0958   CREATININE 1.02 (H) 10/01/2017 1027   ALBUMIN 4.7 04/18/2017 1115   GFRNONAA >60 10/01/2017 1027   GFRAA >60 10/01/2017 1027   Lipase  No results found for: LIPASE     Studies/Results: Dg Chest 2 View  Result Date: 10/02/2017 CLINICAL DATA:  Right pneumothorax with chest tube in place. EXAM: CHEST - 2 VIEW COMPARISON:  10/01/2017 FINDINGS: There is interval recurrence of right pneumothorax with atelectasis in the right mid and lower lung zones. Right chest tube remains in place. Left lung is clear and expanded. Heart size and pulmonary vascularity  are normal. Mediastinal contours appear intact. No blunting of costophrenic angles. IMPRESSION: Recurrent right pneumothorax with atelectasis in the right mid and lower lung zones. Right chest tube remains in place. Electronically Signed   By: Burman Nieves M.D.   On: 10/02/2017 06:46   Dg Chest 2 View  Result Date: 10/01/2017 CLINICAL DATA:  Right-sided chest pain and shortness of breath since yesterday [place] Nonsmoker. EXAM: CHEST - 2 VIEW COMPARISON:  Report of a chest x-ray dated August 14, 2017 FINDINGS: There is a near-total pneumothorax on the right. There is mild mediastinal shift to the left. The left lung is well-expanded but exhibits mildly increased interstitial markings. The heart and pulmonary vascularity are normal. The trachea is midline. The bony thorax exhibits no acute abnormality. IMPRESSION: Near-total right-sided pneumothorax with mild shift of the mediastinum toward the left. Possible underlying interstitial disease on the left and likely in the collapsed right lung. Critical Value/emergent results were called by telephone at the time of interpretation on 10/01/2017 at 10:52 am to Dr. Sharman Cheek , who verbally acknowledged these results. Electronically Signed   By: David  [place] M.D.   On: 10/01/2017 10:55   Dg Chest Port 1 View  Result Date: 10/01/2017 CLINICAL DATA:  Status post chest tube reposition EXAM: PORTABLE CHEST 1 VIEW COMPARISON:  Film from earlier in the same day. FINDINGS: Chest tube has been repositioned on the right with re-expansion of the right lung identified.  No residual pneumothorax is seen. No sizable effusion is noted. Cardiac shadow is stable. Left lung remains clear. Question minimal atelectatic changes in the mid right lung. IMPRESSION: Complete resolution of the previously seen pneumothorax. Some minimal atelectatic changes are noted in the right mid lung. Electronically Signed   By: Alcide CleverMark  Lukens M.D.   On: 10/01/2017  16:11   Dg Chest Portable 1 View  Result Date: 10/01/2017 CLINICAL DATA:  Pneumothorax. EXAM: PORTABLE CHEST 1 VIEW COMPARISON:  No 16 19 FINDINGS: Interval placement RIGHT chest tube positioned in the RIGHT mid hemithorax. There is no expansion of the collapsed RIGHT lung centered about the RIGHT hilum. No clear mediastinal shift. IMPRESSION: Interval placement of RIGHT chest tube. No improvement in pneumothorax/lung expansion Electronically Signed   By: Genevive BiStewart  Edmunds M.D.   On: 10/01/2017 13:20    Anti-infectives: Anti-infectives (From admission, onward)   None       Assessment/Plan  Spontaneous Right Pneumothorax with mild tension component - She noted improvement in her breathing and pain, however, repeat CXR shows recurrent pneumothorax this morning. Will continue to leave on suction today and repeat CXR in the AM. If she worsens throughout the day will re-image sooner.  - No indication of supplemental O2 - Regular diet, DVT prophylaxis, pain control, anti-emetics   LOS: 0 days    Theresa Powell , PA-C [facility] 10/02/2017, 8:51 AM [id number] M-F: 7am - 4pm

## 2017-10-03 ENCOUNTER — Inpatient Hospital Stay: Payer: BLUE CROSS/BLUE SHIELD

## 2017-10-03 LAB — HIV ANTIBODY (ROUTINE TESTING W REFLEX): HIV Screen 4th Generation wRfx: NONREACTIVE

## 2017-10-03 NOTE — Progress Notes (Signed)
[facility] Progress Note     Subjective: Resting comfortably in bed this morning. Notes that her breathing and her right chest pain continues to improve. Her nausea is significantly better. No additional complaints.   Objective: Vital signs in last 24 hours: Temp:  [97.6 F (36.4 C)-98.8 F (37.1 C)] 98.5 F (36.9 C) (09/18 0530) Pulse Rate:  [60-77] 60 (09/18 0530) Resp:  [16-20] 19 (09/18 0530) BP: (123-126)/(82-88) 126/88 (09/18 0530) SpO2:  [96 %-99 %] 99 % (09/18 0530) Last BM Date: 10/01/17  Intake/Output from previous day: 09/17 0701 - 09/18 0700 In: 960 [P.O.:960] Out: 68 [Chest Tube:68] Intake/Output this shift: No intake/output data recorded.  PE: Gen:  Alert, NAD, pleasant Card:  Regular rate and rhythm, pedal pulses 2+ BL Pulm:  Normal effort, clear to auscultation bilaterally Skin: warm and dry, no rashes  Psych: A&Ox3   Lab Results:  Recent Labs    10/01/17 1027  WBC 5.7  HGB 13.6  HCT 40.1  PLT 248   BMET Recent Labs    10/01/17 1027  CREATININE 1.02*   PT/INR No results for input(s): LABPROT, INR in the last 72 hours. CMP     Component Value Date/Time   GLUCOSE 101 (H) 04/18/2017 0958   CREATININE 1.02 (H) 10/01/2017 1027   ALBUMIN 4.7 04/18/2017 1115   GFRNONAA >60 10/01/2017 1027   GFRAA >60 10/01/2017 1027   Lipase  No results found for: LIPASE     Studies/Results: Dg Chest 2 View  Result Date: 10/02/2017 CLINICAL DATA:  Right pneumothorax with chest tube in place. EXAM: CHEST - 2 VIEW COMPARISON:  10/01/2017 FINDINGS: There is interval recurrence of right pneumothorax with atelectasis in the right mid and lower lung zones. Right chest tube remains in place. Left lung is clear and expanded. Heart size and pulmonary vascularity are normal. Mediastinal contours appear intact. No blunting of costophrenic angles. IMPRESSION: Recurrent right pneumothorax with atelectasis in the right mid and lower lung zones. Right  chest tube remains in place. Electronically Signed   By: Burman NievesWilliam  Stevens M.D.   On: 10/02/2017 06:46   Dg Chest 2 View  Result Date: 10/01/2017 CLINICAL DATA:  Right-sided chest pain and shortness of breath since [place] Nonsm[place]. EXAM: CHEST - 2 VIEW COMPARISON:  Report of a chest x-ray dated August 14, 2017 FINDINGS: There is a near-total pneumothorax on the right. There is mild mediastinal shift to the left. The left lung is well-expanded but exhibits mildly increased interstitial markings. The heart and pulmonary vascularity are normal. The trachea is midline. The bony thorax exhibits no acute abnormality. IMPRESSION: Near-total right-sided pneumothorax with mild shift of the mediastinum toward the left. Possible underlying interstitial disease on the left and likely in the collapsed right lung. Critical Value/emergent results were called by telephone at the time of interpretation on 10/01/2017 at 10:52 am to Dr. Sharman CheekPHILLIP STAFFORD , who verbally acknowledged these results. Electronically Signed   By: David  [place] M.D.   On: 10/01/2017 10:55   Dg Chest Port 1 View  Result Date: 10/03/2017 CLINICAL DATA:  Follow-up examination for right-sided pneumothorax. EXAM: PORTABLE CHEST 1 VIEW COMPARISON:  Prior radiograph from 10/02/2017. FINDINGS: Cardiac mediastinal silhouettes are stable in size and contour, and remain within normal limits. Lungs hypoinflated with elevation of the left hemidiaphragm. Right-sided pigtail chest tube remains in place with tip overlying the right lung base. There has been interval re-expansion of previously seen right-sided pneumothorax. Trace  residual mild scattered bibasilar subsegmental atelectasis. No focal infiltrates. No edema or effusion. Osseous structures unchanged. Pneumothorax is seen at the right lung apex IMPRESSION: 1. Right-sided chest tube in place with tip overlying the right lung base. Previously seen right-sided pneumothorax  as largely re-expanded, with a trace residual pneumothorax now seen at the right lung apex. 2. Low lung volumes with mild bibasilar subsegmental atelectasis. Electronically Signed   By: Rise Mu M.D.   On: 10/03/2017 05:42   Dg Chest Port 1 View  Result Date: 10/01/2017 CLINICAL DATA:  Status post chest tube reposition EXAM: PORTABLE CHEST 1 VIEW COMPARISON:  Film from earlier in the same day. FINDINGS: Chest tube has been repositioned on the right with re-expansion of the right lung identified. No residual pneumothorax is seen. No sizable effusion is noted. Cardiac shadow is stable. Left lung remains clear. Question minimal atelectatic changes in the mid right lung. IMPRESSION: Complete resolution of the previously seen pneumothorax. Some minimal atelectatic changes are noted in the right mid lung. Electronically Signed   By: Alcide Clever M.D.   On: 10/01/2017 16:11   Dg Chest Portable 1 View  Result Date: 10/01/2017 CLINICAL DATA:  Pneumothorax. EXAM: PORTABLE CHEST 1 VIEW COMPARISON:  No 16 19 FINDINGS: Interval placement RIGHT chest tube positioned in the RIGHT mid hemithorax. There is no expansion of the collapsed RIGHT lung centered about the RIGHT hilum. No clear mediastinal shift. IMPRESSION: Interval placement of RIGHT chest tube. No improvement in pneumothorax/lung expansion Electronically Signed   By: Genevive Bi M.D.   On: 10/01/2017 13:20    Anti-infectives: Anti-infectives (From admission, onward)   None       Assessment/Plan   Spontaneous Right Pneumothorax with mild tension component - Continues to note improvement in her breathing and pain. CXR this morning shows only a small residual right apical PTX. She continues to have a small air leak which is improved from yesterday. Will place on water seal this morning and repeat imaging this afternoon.  - Continue pain control, anti-emetics - Regular diet - Mobilize - DVT Prophylaxis     LOS: 1 day    Lynden Oxford , PA-C [facility] 10/03/2017, 9:00 AM [phone] M-F: 7am - 4pm

## 2017-10-04 ENCOUNTER — Inpatient Hospital Stay: Payer: BLUE CROSS/BLUE SHIELD

## 2017-10-04 NOTE — Progress Notes (Signed)
[facility] Progress Note     Subjective: Feeling good this morning sitting up in bed. No complaints of CP or worsening SOB this morning. No fever, chills. Tolerating a diet well. Mobilizing.   Objective: Vital signs in last 24 hours: Temp:  [97.6 F (36.4 C)-98.6 F (37 C)] 97.6 F (36.4 C) (09/19 0513) Pulse Rate:  [72-78] 72 (09/19 0513) Resp:  [18-20] 19 (09/19 0513) BP: (122-143)/(77-94) 143/94 (09/19 0513) SpO2:  [97 %-99 %] 97 % (09/19 0513) Last BM Date: 10/02/17  Intake/Output from previous day: 09/18 0701 - 09/19 0700 In: 360 [P.O.:360] Out: 19 [Chest Tube:19] Intake/Output this shift: No intake/output data recorded.  PE: Gen:  Alert, NAD, pleasant Card:  Regular rate and rhythm, no m/r/g Pulm:  Normal effort, clear to auscultation bilaterally Abd: Soft, non-tender, non-distended Skin: warm and dry, no rashes  Psych: A&Ox3   Lab Results:  Recent Labs    10/01/17 1027  WBC 5.7  HGB 13.6  HCT 40.1  PLT 248   BMET Recent Labs    10/01/17 1027  CREATININE 1.02*   PT/INR No results for input(s): LABPROT, INR in the last 72 hours. CMP     Component Value Date/Time   GLUCOSE 101 (H) 04/18/2017 0958   CREATININE 1.02 (H) 10/01/2017 1027   ALBUMIN 4.7 04/18/2017 1115   GFRNONAA >60 10/01/2017 1027   GFRAA >60 10/01/2017 1027   Lipase  No results found for: LIPASE     Studies/Results: Dg Chest Port 1 View  Result Date: 10/03/2017 CLINICAL DATA:  Follow-up right pneumothorax EXAM: PORTABLE CHEST 1 VIEW COMPARISON:  10/03/2017 FINDINGS: Right-sided chest tube is again identified and stable. The minimal pneumothorax seen on the prior exam is not as well visualized on the current study. No new focal infiltrate or effusion is seen. Cardiac shadow is stable. IMPRESSION: No definitive pneumothorax noted on this exam. Electronically Signed   By: Alcide CleverMark  Lukens M.D.   On: 10/03/2017 14:25   Dg Chest Port 1 View  Result Date:  10/03/2017 CLINICAL DATA:  Follow-up examination for right-sided pneumothorax. EXAM: PORTABLE CHEST 1 VIEW COMPARISON:  Prior radiograph from 10/02/2017. FINDINGS: Cardiac mediastinal silhouettes are stable in size and contour, and remain within normal limits. Lungs hypoinflated with elevation of the left hemidiaphragm. Right-sided pigtail chest tube remains in place with tip overlying the right lung base. There has been interval re-expansion of previously seen right-sided pneumothorax. Trace residual mild scattered bibasilar subsegmental atelectasis. No focal infiltrates. No edema or effusion. Osseous structures unchanged. Pneumothorax is seen at the right lung apex IMPRESSION: 1. Right-sided chest tube in place with tip overlying the right lung base. Previously seen right-sided pneumothorax as largely re-expanded, with a trace residual pneumothorax now seen at the right lung apex. 2. Low lung volumes with mild bibasilar subsegmental atelectasis. Electronically Signed   By: Rise MuBenjamin  McClintock M.D.   On: 10/03/2017 05:42    Anti-infectives: Anti-infectives (From admission, onward)   None       Assessment/Plan   SpontaneousRight Pneumothorax with mild tension component  - CXR this morning still concerning for possible residual right PTX which is worse when compared with yesterdays film, will likely leave on water seal for the day and re-image tomorrow. Still appears to have an improving air leak on pleurovac at this time.  - Continue pain control, anti-emetics - Regular diet - Mobilize - DVT Prophylaxis    LOS: 2 days    Lynden OxfordZachary Kamilya Wakeman , PA-C [facility] 10/04/2017, 8:29  AM [phone] M-F: 7am - 4pm

## 2017-10-05 ENCOUNTER — Inpatient Hospital Stay: Payer: BLUE CROSS/BLUE SHIELD

## 2017-10-05 NOTE — Progress Notes (Signed)
[facility] Progress Note     Subjective: Resting comfortably in bed. No complaints of CP, SOB, fever, chills. Tolerating a diet. Still continues to hear bubbling from the pleuravac.   Objective: Vital signs in last 24 hours: Temp:  [98.1 F (36.7 C)-98.9 F (37.2 C)] 98.4 F (36.9 C) (09/20 0524) Pulse Rate:  [68-77] 75 (09/20 0524) Resp:  [16-20] 20 (09/20 0524) BP: (120-139)/(83-97) 120/97 (09/20 0524) SpO2:  [98 %] 98 % (09/20 0524) Last BM Date: 10/04/17  Intake/Output from previous day: 09/19 0701 - 09/20 0700 In: 480 [P.O.:480] Out: 20 [Chest Tube:20] Intake/Output this shift: No intake/output data recorded.  PE: Gen:  Alert, NAD, pleasant Card:  Regular rate and rhythm, pedal pulses 2+ BL Pulm:  Normal effort, clear to auscultation bilaterally Abd: Soft, non-tender, non-distended Skin: warm and dry, no rashes  Psych: A&Ox3   Lab Results:  No results for input(s): WBC, HGB, HCT, PLT in the last 72 hours. BMET No results for input(s): NA, K, CL, CO2, GLUCOSE, BUN, CREATININE, CALCIUM in the last 72 hours. PT/INR No results for input(s): LABPROT, INR in the last 72 hours. CMP     Component Value Date/Time   GLUCOSE 101 (H) 04/18/2017 0958   CREATININE 1.02 (H) 10/01/2017 1027   ALBUMIN 4.7 04/18/2017 1115   GFRNONAA >60 10/01/2017 1027   GFRAA >60 10/01/2017 1027   Lipase  No results found for: LIPASE     Studies/Results: Dg Chest Port 1 View  Result Date: 10/04/2017 CLINICAL DATA:  Pneumothorax on right EXAM: PORTABLE CHEST 1 VIEW COMPARISON:  10/03/2017 FINDINGS: Right basilar chest tube remains in place, unchanged. Recurrent right pneumothorax noted over the apex and lateral upper lung, approximately 10% in size. Lungs are clear. Heart is normal size. No effusions. IMPRESSION: Recurrent right pneumothorax, approximately 10%. Electronically Signed   By: Charlett NoseKevin  Dover M.D.   On: 10/04/2017 09:15   Dg Chest Port 1 View  Result Date:  10/03/2017 CLINICAL DATA:  Follow-up right pneumothorax EXAM: PORTABLE CHEST 1 VIEW COMPARISON:  10/03/2017 FINDINGS: Right-sided chest tube is again identified and stable. The minimal pneumothorax seen on the prior exam is not as well visualized on the current study. No new focal infiltrate or effusion is seen. Cardiac shadow is stable. IMPRESSION: No definitive pneumothorax noted on this exam. Electronically Signed   By: Alcide CleverMark  Lukens M.D.   On: 10/03/2017 14:25    Anti-infectives: Anti-infectives (From admission, onward)   None       Assessment/Plan  SpontaneousRight Pneumothorax with mild tension component   - CXR this morning still showing a minimal residual PTX and she continues to have an air leak, which appears to be improving some. Will continue to leave on water seal today and discussed the potential for pleurodesis tomorrow. Will reassess in the morning - Continue pain control, anti-emetics - DVT prophylaxis - Mobilize     LOS: 3 days    Lynden OxfordZachary Jaikob Borgwardt , PA-C [facility] 10/05/2017, 8:42 AM [phone] M-F: 7am - 4pm

## 2017-10-06 ENCOUNTER — Inpatient Hospital Stay: Payer: BLUE CROSS/BLUE SHIELD

## 2017-10-06 ENCOUNTER — Encounter: Payer: Self-pay | Admitting: Radiology

## 2017-10-06 MED ORDER — IOHEXOL 300 MG/ML  SOLN
75.0000 mL | Freq: Once | INTRAMUSCULAR | Status: AC | PRN
  Administered 2017-10-06: 75 mL via INTRAVENOUS

## 2017-10-06 NOTE — Progress Notes (Signed)
10/06/2017  Subjective: Patient's pneumothorax redeveloped on this morning's cxr.  Back to suction today.  Obtained CT scan which shows significant right middle lobe emphysema, congenital.    Vital signs: Temp:  [97.8 F (36.6 C)-98.3 F (36.8 C)] 98.3 F (36.8 C) (09/21 1227) Pulse Rate:  [66-84] 84 (09/21 1227) Resp:  [18-24] 18 (09/21 1227) BP: (138-142)/(88-89) 138/89 (09/21 1227) SpO2:  [99 %-100 %] 100 % (09/21 1227)   Intake/Output: 09/20 0701 - 09/21 0700 In: 480 [P.O.:480] Out: 6 [Chest Tube:6] Last BM Date: 10/05/17  Physical Exam: Constitutional: No acute distress  Pulm:  Right chest tube in place, to suction, with persistent air leak.  Lungs clear bilaterally.  Labs:  No results for input(s): WBC, HGB, HCT, PLT in the last 72 hours. No results for input(s): NA, K, CL, CO2, GLUCOSE, BUN, CREATININE, CALCIUM in the last 72 hours.  Invalid input(s): MAGNESIUM No results for input(s): LABPROT, INR in the last 72 hours.  Imaging: Ct Chest W Contrast  Result Date: 10/06/2017 CLINICAL DATA:  Persistent spontaneous right pneumothorax even after chest tube insertion. EXAM: CT CHEST WITH CONTRAST TECHNIQUE: Multidetector CT imaging of the chest was performed during intravenous contrast administration. CONTRAST:  75mL OMNIPAQUE IOHEXOL 300 MG/ML  SOLN COMPARISON:  Chest x-rays from 10/01/2017 to 10/06/2017 FINDINGS: Cardiovascular: No significant vascular findings. Normal heart size. No pericardial effusion. Mediastinum/Nodes: No enlarged mediastinal, hilar, or axillary lymph nodes. Thyroid gland, trachea, and esophagus demonstrate no significant findings. Lungs/Pleura: There is extensive congenital lobar emphysema which involves the right middle lobe. There is a mass effect upon the adjacent right upper lobe with a slight mass effect upon the mediastinum. There is a tiny anterior right pneumothorax. Chest tube is present at the right lung base anteriorly where there is a tiny  component of the pneumothorax. There are several small blebs in the left lower lobe and a single small bleb in the right lower lobe. No pleural effusions. Upper Abdomen: Normal. Musculoskeletal: Normal. IMPRESSION: 1. 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 2. Minimal right anterior and basilar pneumothorax. 3. Small blebs in both lower lobes. Electronically Signed   By: Francene BoyersJames  Maxwell M.D.   On: 10/06/2017 12:28   Dg Chest Port 1 View  Result Date: 10/06/2017 CLINICAL DATA:  Pneumothorax EXAM: PORTABLE CHEST 1 VIEW COMPARISON:  10/05/2017 FINDINGS: Moderate to large right pneumothorax, increased. Indwelling right pigtail chest tube. Left lung is clear. The heart is normal in size. IMPRESSION: Moderate to large right pneumothorax, increased. Indwelling right pigtail chest tube. Correlate for chest tube patency. These results will be called to the ordering clinician or representative by the Radiologist Assistant, and communication documented in the PACS or zVision Dashboard. Electronically Signed   By: Charline BillsSriyesh  Krishnan M.D.   On: 10/06/2017 07:51    Assessment/Plan: This is a 37 y.o. female with right pneumothorax  --discussed patient with Dr. Thelma Bargeaks and will take her to OR on Monday for right thoracotomy and right middle lobectomy.  Discussed with the patient the CT scan findings and the surgery itself, including risks of bleeding, infection, and injury to surrounding structures, having chest tubes afterwards, post-op outcomes and expectations.  She's willing to proceed. --continue chest tube to suction.   Howie IllJose Luis Siddh Vandeventer, MD [facility]

## 2017-10-06 NOTE — Progress Notes (Signed)
Dr. Aleen CampiPiscoya paged regarding results of this mornings portable chest xray results as [facility] Dr. Aleen CampiPiscoya is aware of the results and plans to see the patient soon this morning. An order was given to place the pleurvac back to wall suction at 60 and the pleurvac setting at 20. This has been done.

## 2017-10-07 ENCOUNTER — Inpatient Hospital Stay: Payer: BLUE CROSS/BLUE SHIELD

## 2017-10-07 DIAGNOSIS — J939 Pneumothorax, unspecified: Secondary | ICD-10-CM

## 2017-10-07 LAB — MRSA PCR SCREENING: MRSA by PCR: NEGATIVE

## 2017-10-07 MED ORDER — LACTATED RINGERS IV SOLN
INTRAVENOUS | Status: DC
  Administered 2017-10-07 – 2017-10-08 (×2): via INTRAVENOUS

## 2017-10-07 NOTE — Progress Notes (Signed)
Patient complaining of a new onset of bilateral pain that is mild with warmth and numbness. DVT s/s not noted during assessment. Made Dr. Aleen CampiPiscoya aware of new onset of pain.

## 2017-10-07 NOTE — Progress Notes (Signed)
10/07/2017  Subjective: No acute events overnight.  Chest tube remained to suction and her CXR shows lung remains expanded.  However, she has significant congenital lobar emphysema of the right middle lobe.    Vital signs: Temp:  [98.3 F (36.8 C)-98.4 F (36.9 C)] 98.4 F (36.9 C) (09/21 2112) Pulse Rate:  [75-84] 75 (09/21 2112) Resp:  [18-20] 20 (09/21 2112) BP: (125-138)/(89) 125/89 (09/21 2112) SpO2:  [100 %] 100 % (09/21 2112)   Intake/Output: 09/21 0701 - 09/22 0700 In: 0  Out: 51 [Chest Tube:51] Last BM Date: 10/05/17  Physical Exam: Constitutional: No acute distress Pulm: right chest tube in place, to suction, with air leak on pleuravac still.  CXR shows right lung expanded.   Labs:  No results for input(s): WBC, HGB, HCT, PLT in the last 72 hours. No results for input(s): NA, K, CL, CO2, GLUCOSE, BUN, CREATININE, CALCIUM in the last 72 hours.  Invalid input(s): MAGNESIUM No results for input(s): LABPROT, INR in the last 72 hours.  Imaging: Dg Chest Port 1 View  Result Date: 10/07/2017 CLINICAL DATA:  Followup right-sided chest tube. EXAM: PORTABLE CHEST 1 VIEW COMPARISON:  CT and chest radiograph, 10/06/2017 FINDINGS: Pigtail chest tube curls in right lung base, stable from the prior exams. No convincing pneumothorax.  Lungs are clear. Heart, mediastinum and hila are unremarkable. IMPRESSION: 1. Stable right inferior hemithorax chest tube. 2. No pneumothorax. Electronically Signed   By: Amie Portlandavid  Ormond M.D.   On: 10/07/2017 08:10    Assessment/Plan: This is a 37 y.o. female with right pneumothorax and findings of right middle lobe congenital lobar emphysema.  Discussed again with the patient the role for surgery and plans for right thoracotomy and right middle lobe lobectomy tomorrow with Dr. Thelma Bargeaks. Patient will be NPO after midnight, with gentle IV fluid hydration after midnight, and will get preop labs as well.   Howie IllJose Luis Khristina Janota, MD [facility]

## 2017-10-08 ENCOUNTER — Inpatient Hospital Stay: Payer: BLUE CROSS/BLUE SHIELD | Admitting: Anesthesiology

## 2017-10-08 ENCOUNTER — Encounter: Payer: Self-pay | Admitting: Anesthesiology

## 2017-10-08 ENCOUNTER — Inpatient Hospital Stay: Payer: BLUE CROSS/BLUE SHIELD

## 2017-10-08 ENCOUNTER — Encounter
Admission: EM | Disposition: A | Discharge status: Home / Self Care | Payer: Self-pay | Source: Home / Self Care | Attending: Surgery

## 2017-10-08 DIAGNOSIS — J930 Spontaneous tension pneumothorax: Principal | ICD-10-CM

## 2017-10-08 HISTORY — PX: THORACOTOMY: SHX5074

## 2017-10-08 LAB — COMPREHENSIVE METABOLIC PANEL
ALT: 47 U/L — AB (ref 0–44)
AST: 26 U/L (ref 15–41)
Albumin: 4.3 g/dL (ref 3.5–5.0)
Alkaline Phosphatase: 106 U/L (ref 38–126)
Anion gap: 7 (ref 5–15)
BILIRUBIN TOTAL: 0.6 mg/dL (ref 0.3–1.2)
BUN: 13 mg/dL (ref 6–20)
CALCIUM: 9.1 mg/dL (ref 8.9–10.3)
CHLORIDE: 107 mmol/L (ref 98–111)
CO2: 24 mmol/L (ref 22–32)
CREATININE: 0.96 mg/dL (ref 0.44–1.00)
Glucose, Bld: 97 mg/dL (ref 70–99)
Potassium: 3.9 mmol/L (ref 3.5–5.1)
SODIUM: 138 mmol/L (ref 135–145)
TOTAL PROTEIN: 7.7 g/dL (ref 6.5–8.1)

## 2017-10-08 LAB — BASIC METABOLIC PANEL
ANION GAP: 5 (ref 5–15)
BUN: 15 mg/dL (ref 6–20)
CALCIUM: 8.7 mg/dL — AB (ref 8.9–10.3)
CO2: 27 mmol/L (ref 22–32)
Chloride: 106 mmol/L (ref 98–111)
Creatinine, Ser: 1.05 mg/dL — ABNORMAL HIGH (ref 0.44–1.00)
GFR calc Af Amer: >60 mL/min (ref >60)
GFR calc non Af Amer: >60 mL/min (ref >60)
GLUCOSE: 100 mg/dL — AB (ref 70–99)
Potassium: 4.1 mmol/L (ref 3.5–5.1)
Sodium: 138 mmol/L (ref 135–145)

## 2017-10-08 LAB — CBC WITH DIFFERENTIAL/PLATELET
BASOS ABS: 0.1 10*3/uL (ref 0–0.1)
Basophils Relative: 1 %
EOS PCT: 8 %
Eosinophils Absolute: 0.5 10*3/uL (ref 0–0.7)
HEMATOCRIT: 37.7 % (ref 35.0–47.0)
Hemoglobin: 12.8 g/dL (ref 12.0–16.0)
LYMPHS PCT: 43 %
Lymphs Abs: 2.7 10*3/uL (ref 1.0–3.6)
MCH: 27 pg (ref 26.0–34.0)
MCHC: 33.9 g/dL (ref 32.0–36.0)
MCV: 79.7 fL — ABNORMAL LOW (ref 80.0–100.0)
MONO ABS: 0.5 10*3/uL (ref 0.2–0.9)
Monocytes Relative: 8 %
NEUTROS ABS: 2.6 10*3/uL (ref 1.4–6.5)
Neutrophils Relative %: 40 %
PLATELETS: 236 10*3/uL (ref 150–440)
RBC: 4.73 MIL/uL (ref 3.80–5.20)
RDW: 14.1 % (ref 11.5–14.5)
WBC: 6.4 10*3/uL (ref 3.6–11.0)

## 2017-10-08 LAB — CBC
HEMATOCRIT: 39.5 % (ref 35.0–47.0)
HEMOGLOBIN: 13.5 g/dL (ref 12.0–16.0)
MCH: 27.1 pg (ref 26.0–34.0)
MCHC: 34.3 g/dL (ref 32.0–36.0)
MCV: 79.1 fL — AB (ref 80.0–100.0)
Platelets: 255 10*3/uL (ref 150–440)
RBC: 4.99 MIL/uL (ref 3.80–5.20)
RDW: 13.8 % (ref 11.5–14.5)
WBC: 5.8 10*3/uL (ref 3.6–11.0)

## 2017-10-08 LAB — PROTIME-INR
INR: 0.93
PROTHROMBIN TIME: 12.4 s (ref 11.4–15.2)

## 2017-10-08 LAB — APTT: aPTT: 31 seconds (ref 24–36)

## 2017-10-08 LAB — MAGNESIUM: Magnesium: 2 mg/dL (ref 1.7–2.4)

## 2017-10-08 LAB — ABO/RH: ABO/RH(D): B POS

## 2017-10-08 SURGERY — THORACOTOMY, MAJOR
Anesthesia: General

## 2017-10-08 MED ORDER — LIDOCAINE HCL (PF) 2 % IJ SOLN
INTRAMUSCULAR | Status: AC
  Filled 2017-10-08: qty 10

## 2017-10-08 MED ORDER — CEFAZOLIN SODIUM-DEXTROSE 2-4 GM/100ML-% IV SOLN
INTRAVENOUS | Status: AC
  Filled 2017-10-08: qty 100

## 2017-10-08 MED ORDER — LIDOCAINE HCL (CARDIAC) PF 100 MG/5ML IV SOSY
PREFILLED_SYRINGE | INTRAVENOUS | Status: DC | PRN
  Administered 2017-10-08: 100 mg via INTRAVENOUS

## 2017-10-08 MED ORDER — METHOCARBAMOL 500 MG PO TABS
500.0000 mg | ORAL_TABLET | Freq: Four times a day (QID) | ORAL | Status: DC
  Administered 2017-10-08 – 2017-10-12 (×14): 500 mg via ORAL
  Filled 2017-10-08 (×17): qty 1

## 2017-10-08 MED ORDER — BUPIVACAINE LIPOSOME 1.3 % IJ SUSP
INTRAMUSCULAR | Status: AC
  Filled 2017-10-08: qty 20

## 2017-10-08 MED ORDER — HYDROMORPHONE HCL 1 MG/ML IJ SOLN
INTRAMUSCULAR | Status: AC
  Administered 2017-10-08: 0.25 mg via INTRAVENOUS
  Filled 2017-10-08: qty 1

## 2017-10-08 MED ORDER — BUPIVACAINE HCL (PF) 0.25 % IJ SOLN
INTRAMUSCULAR | Status: AC
  Filled 2017-10-08: qty 30

## 2017-10-08 MED ORDER — ONDANSETRON HCL 4 MG/2ML IJ SOLN
INTRAMUSCULAR | Status: AC
  Filled 2017-10-08: qty 2

## 2017-10-08 MED ORDER — PROPOFOL 10 MG/ML IV BOLUS
INTRAVENOUS | Status: DC | PRN
  Administered 2017-10-08: 180 mg via INTRAVENOUS

## 2017-10-08 MED ORDER — DEXAMETHASONE SODIUM PHOSPHATE 10 MG/ML IJ SOLN
INTRAMUSCULAR | Status: DC | PRN
  Administered 2017-10-08: 10 mg via INTRAVENOUS

## 2017-10-08 MED ORDER — FENTANYL CITRATE (PF) 100 MCG/2ML IJ SOLN
INTRAMUSCULAR | Status: AC
  Filled 2017-10-08: qty 2

## 2017-10-08 MED ORDER — ACETAMINOPHEN 10 MG/ML IV SOLN
INTRAVENOUS | Status: DC | PRN
  Administered 2017-10-08: 1000 mg via INTRAVENOUS

## 2017-10-08 MED ORDER — SODIUM CHLORIDE 0.9 % IV SOLN
INTRAVENOUS | Status: DC | PRN
  Administered 2017-10-08: 70 mL

## 2017-10-08 MED ORDER — BUPIVACAINE HCL 0.25 % IJ SOLN
INTRAMUSCULAR | Status: DC | PRN
  Administered 2017-10-08: 30 mL

## 2017-10-08 MED ORDER — MEPERIDINE HCL 50 MG/ML IJ SOLN
INTRAMUSCULAR | Status: AC
  Administered 2017-10-08: 25 mg
  Filled 2017-10-08: qty 1

## 2017-10-08 MED ORDER — FENTANYL CITRATE (PF) 250 MCG/5ML IJ SOLN
INTRAMUSCULAR | Status: AC
  Filled 2017-10-08: qty 5

## 2017-10-08 MED ORDER — CEFAZOLIN SODIUM-DEXTROSE 2-4 GM/100ML-% IV SOLN
2.0000 g | INTRAVENOUS | Status: AC
  Administered 2017-10-08: 2 g via INTRAVENOUS

## 2017-10-08 MED ORDER — MUPIROCIN 2 % EX OINT
1.0000 "application " | TOPICAL_OINTMENT | Freq: Two times a day (BID) | CUTANEOUS | Status: DC
  Filled 2017-10-08: qty 22

## 2017-10-08 MED ORDER — HYDROMORPHONE HCL 1 MG/ML IJ SOLN
INTRAMUSCULAR | Status: AC
  Filled 2017-10-08: qty 1

## 2017-10-08 MED ORDER — DEXTROSE-NACL 5-0.45 % IV SOLN
INTRAVENOUS | Status: DC
  Administered 2017-10-08 – 2017-10-09 (×2): via INTRAVENOUS

## 2017-10-08 MED ORDER — FENTANYL CITRATE (PF) 100 MCG/2ML IJ SOLN
25.0000 ug | INTRAMUSCULAR | Status: AC | PRN
  Administered 2017-10-08 (×6): 25 ug via INTRAVENOUS

## 2017-10-08 MED ORDER — FENTANYL CITRATE (PF) 100 MCG/2ML IJ SOLN
INTRAMUSCULAR | Status: DC | PRN
  Administered 2017-10-08: 25 ug via INTRAVENOUS
  Administered 2017-10-08 (×3): 50 ug via INTRAVENOUS
  Administered 2017-10-08: 100 ug via INTRAVENOUS
  Administered 2017-10-08: 25 ug via INTRAVENOUS
  Administered 2017-10-08: 50 ug via INTRAVENOUS

## 2017-10-08 MED ORDER — MIDAZOLAM HCL 2 MG/2ML IJ SOLN
INTRAMUSCULAR | Status: DC | PRN
  Administered 2017-10-08 (×2): 2 mg via INTRAVENOUS

## 2017-10-08 MED ORDER — ACETAMINOPHEN 10 MG/ML IV SOLN
1000.0000 mg | Freq: Four times a day (QID) | INTRAVENOUS | Status: DC
  Filled 2017-10-08 (×2): qty 100

## 2017-10-08 MED ORDER — CEFAZOLIN SODIUM-DEXTROSE 2-4 GM/100ML-% IV SOLN
2.0000 g | Freq: Three times a day (TID) | INTRAVENOUS | Status: AC
  Administered 2017-10-09: 2 g via INTRAVENOUS
  Filled 2017-10-08 (×3): qty 100

## 2017-10-08 MED ORDER — FENTANYL CITRATE (PF) 100 MCG/2ML IJ SOLN
INTRAMUSCULAR | Status: AC
  Administered 2017-10-08: 25 ug via INTRAVENOUS
  Filled 2017-10-08: qty 2

## 2017-10-08 MED ORDER — ALBUTEROL SULFATE (2.5 MG/3ML) 0.083% IN NEBU
2.5000 mg | INHALATION_SOLUTION | Freq: Four times a day (QID) | RESPIRATORY_TRACT | Status: DC
  Administered 2017-10-08 – 2017-10-10 (×6): 2.5 mg via RESPIRATORY_TRACT
  Filled 2017-10-08 (×6): qty 3

## 2017-10-08 MED ORDER — KETOROLAC TROMETHAMINE 15 MG/ML IJ SOLN
15.0000 mg | Freq: Four times a day (QID) | INTRAMUSCULAR | Status: DC
  Administered 2017-10-08: 15 mg via INTRAVENOUS
  Filled 2017-10-08: qty 1

## 2017-10-08 MED ORDER — ONDANSETRON HCL 4 MG/2ML IJ SOLN
4.0000 mg | Freq: Four times a day (QID) | INTRAMUSCULAR | Status: DC | PRN
  Administered 2017-10-09 (×2): 4 mg via INTRAVENOUS
  Filled 2017-10-08 (×2): qty 2

## 2017-10-08 MED ORDER — HYDROMORPHONE HCL 1 MG/ML IJ SOLN
INTRAMUSCULAR | Status: DC | PRN
  Administered 2017-10-08 (×2): 0.5 mg via INTRAVENOUS

## 2017-10-08 MED ORDER — MIDAZOLAM HCL 2 MG/2ML IJ SOLN
INTRAMUSCULAR | Status: AC
  Filled 2017-10-08: qty 2

## 2017-10-08 MED ORDER — ACETAMINOPHEN 10 MG/ML IV SOLN
INTRAVENOUS | Status: AC
  Filled 2017-10-08: qty 100

## 2017-10-08 MED ORDER — HYDROCODONE-ACETAMINOPHEN 5-325 MG PO TABS
1.0000 | ORAL_TABLET | ORAL | Status: DC | PRN
  Administered 2017-10-08 – 2017-10-09 (×4): 2 via ORAL
  Filled 2017-10-08 (×5): qty 2

## 2017-10-08 MED ORDER — PROPOFOL 10 MG/ML IV BOLUS
INTRAVENOUS | Status: AC
  Filled 2017-10-08: qty 20

## 2017-10-08 MED ORDER — ALBUTEROL SULFATE (2.5 MG/3ML) 0.083% IN NEBU: 2.5000 mg | INHALATION_SOLUTION | RESPIRATORY_TRACT | Status: DC

## 2017-10-08 MED ORDER — FENTANYL CITRATE (PF) 100 MCG/2ML IJ SOLN
50.0000 ug | Freq: Once | INTRAMUSCULAR | Status: AC
  Administered 2017-10-08: 50 ug via INTRAVENOUS

## 2017-10-08 MED ORDER — MEPERIDINE HCL 25 MG/ML IJ SOLN: 25.0000 mg | Freq: Once | INTRAMUSCULAR | Status: DC

## 2017-10-08 MED ORDER — ROCURONIUM BROMIDE 50 MG/5ML IV SOLN
INTRAVENOUS | Status: AC
  Filled 2017-10-08: qty 2

## 2017-10-08 MED ORDER — ACETAMINOPHEN 10 MG/ML IV SOLN
1000.0000 mg | Freq: Once | INTRAVENOUS | Status: AC
  Administered 2017-10-08: 1000 mg via INTRAVENOUS
  Filled 2017-10-08: qty 100

## 2017-10-08 MED ORDER — MORPHINE SULFATE (PF) 2 MG/ML IV SOLN
1.0000 mg | INTRAVENOUS | Status: DC | PRN
  Administered 2017-10-08 – 2017-10-09 (×5): 2 mg via INTRAVENOUS
  Filled 2017-10-08 (×6): qty 1

## 2017-10-08 MED ORDER — HYDROMORPHONE HCL 1 MG/ML IJ SOLN
0.2500 mg | INTRAMUSCULAR | Status: AC | PRN
  Administered 2017-10-08 (×8): 0.25 mg via INTRAVENOUS

## 2017-10-08 MED ORDER — ROCURONIUM BROMIDE 100 MG/10ML IV SOLN
INTRAVENOUS | Status: DC | PRN
  Administered 2017-10-08: 20 mg via INTRAVENOUS
  Administered 2017-10-08: 10 mg via INTRAVENOUS
  Administered 2017-10-08: 80 mg via INTRAVENOUS

## 2017-10-08 MED ORDER — TRAMADOL HCL 50 MG PO TABS
50.0000 mg | ORAL_TABLET | Freq: Four times a day (QID) | ORAL | Status: DC
  Administered 2017-10-08 – 2017-10-09 (×3): 100 mg via ORAL
  Filled 2017-10-08 (×4): qty 2

## 2017-10-08 MED ORDER — ONDANSETRON HCL 4 MG/2ML IJ SOLN: 4.0000 mg | Freq: Once | INTRAMUSCULAR | Status: DC | PRN

## 2017-10-08 MED ORDER — LACTATED RINGERS IV SOLN
INTRAVENOUS | Status: DC | PRN
  Administered 2017-10-08: 14:00:00 via INTRAVENOUS

## 2017-10-08 MED ORDER — SODIUM CHLORIDE 0.9 % IJ SOLN
INTRAMUSCULAR | Status: AC
  Filled 2017-10-08: qty 50

## 2017-10-08 MED ORDER — DEXAMETHASONE SODIUM PHOSPHATE 10 MG/ML IJ SOLN
INTRAMUSCULAR | Status: AC
  Filled 2017-10-08: qty 1

## 2017-10-08 MED ORDER — BISACODYL 5 MG PO TBEC
10.0000 mg | DELAYED_RELEASE_TABLET | Freq: Every day | ORAL | Status: DC
  Administered 2017-10-09 – 2017-10-12 (×4): 10 mg via ORAL
  Filled 2017-10-08 (×4): qty 2

## 2017-10-08 SURGICAL SUPPLY — 77 items
BENZOIN TINCTURE PRP APPL 2/3 (GAUZE/BANDAGES/DRESSINGS) IMPLANT
BNDG COHESIVE 4X5 TAN STRL (GAUZE/BANDAGES/DRESSINGS) IMPLANT
BRONCHOSCOPE PED SLIM DISP (MISCELLANEOUS) ×3 IMPLANT
BULB RESERV EVAC DRAIN JP 100C (MISCELLANEOUS) ×3 IMPLANT
CANISTER SUCT 1200ML W/VALVE (MISCELLANEOUS) ×3 IMPLANT
CATH  RT ANGL  28F  SOF (CATHETERS) ×2
CATH RT ANGL 28F SOF (CATHETERS) ×1 IMPLANT
CATH THOR STR 28F  SOFT WA (CATHETERS) ×2
CATH THOR STR 28F SOFT WA (CATHETERS) ×1 IMPLANT
CATH URET ROBINSON 16FR STRL (CATHETERS) IMPLANT
CHLORAPREP W/TINT 26ML (MISCELLANEOUS) ×6 IMPLANT
CLOSURE WOUND 1/2 X4 (GAUZE/BANDAGES/DRESSINGS) ×1
CNTNR SPEC 2.5X3XGRAD LEK (MISCELLANEOUS)
CONN REDUCER 3/8X3/8X3/8Y (CONNECTOR) ×3
CONNECTOR REDUCER 3/8X3/8 (MISCELLANEOUS) ×6 IMPLANT
CONNECTOR REDUCER 3/8X3/8X3/8Y (CONNECTOR) ×1 IMPLANT
CONT SPEC 4OZ STER OR WHT (MISCELLANEOUS)
CONTAINER SPEC 2.5X3XGRAD LEK (MISCELLANEOUS) IMPLANT
CUTTER ECHEON FLEX ENDO 45 340 (ENDOMECHANICALS) ×3 IMPLANT
DRAIN CHEST DRY SUCT SGL (MISCELLANEOUS) ×3 IMPLANT
DRAPE C-SECTION (MISCELLANEOUS) ×3 IMPLANT
DRAPE MAG INST 16X20 L/F (DRAPES) IMPLANT
DRSG OPSITE POSTOP 4X10 (GAUZE/BANDAGES/DRESSINGS) ×3 IMPLANT
DRSG OPSITE POSTOP 4X6 (GAUZE/BANDAGES/DRESSINGS) IMPLANT
DRSG OPSITE POSTOP 4X8 (GAUZE/BANDAGES/DRESSINGS) IMPLANT
DRSG TEGADERM 2-3/8X2-3/4 SM (GAUZE/BANDAGES/DRESSINGS) ×6 IMPLANT
DRSG TEGADERM 4X4.75 (GAUZE/BANDAGES/DRESSINGS) ×6 IMPLANT
DRSG TELFA 3X8 NADH (GAUZE/BANDAGES/DRESSINGS) IMPLANT
DRSG TELFA 4X4 ISLAND ADH (GAUZE/BANDAGES/DRESSINGS) IMPLANT
ELECT BLADE 6.5 EXT (BLADE) ×3 IMPLANT
ELECT CAUTERY BLADE TIP 2.5 (TIP) ×3
ELECT REM PT RETURN 9FT ADLT (ELECTROSURGICAL) ×3
ELECTRODE CAUTERY BLDE TIP 2.5 (TIP) ×1 IMPLANT
ELECTRODE REM PT RTRN 9FT ADLT (ELECTROSURGICAL) ×1 IMPLANT
GAUZE SPONGE 4X4 12PLY STRL (GAUZE/BANDAGES/DRESSINGS) ×3 IMPLANT
GLOVE SURG SYN 7.5  E (GLOVE) ×8
GLOVE SURG SYN 7.5 E (GLOVE) ×4 IMPLANT
GOWN STRL REUS W/ TWL LRG LVL3 (GOWN DISPOSABLE) ×4 IMPLANT
GOWN STRL REUS W/TWL LRG LVL3 (GOWN DISPOSABLE) ×8
KIT TURNOVER KIT A (KITS) ×3 IMPLANT
LABEL OR SOLS (LABEL) ×3 IMPLANT
LOOP RED MAXI  1X406MM (MISCELLANEOUS) ×2
LOOP VESSEL MAXI 1X406 RED (MISCELLANEOUS) ×1 IMPLANT
MARKER SKIN DUAL TIP RULER LAB (MISCELLANEOUS) ×3 IMPLANT
NEEDLE SPNL 22GX3.5 QUINCKE BK (NEEDLE) ×3 IMPLANT
PACK BASIN MAJOR ARMC (MISCELLANEOUS) ×3 IMPLANT
RELOAD STAPLER LINE PROX 30 GR (STAPLE) IMPLANT
SPONGE KITTNER 5P (MISCELLANEOUS) ×3 IMPLANT
STAPLE RELOAD 45MM GOLD (STAPLE) ×18 IMPLANT
STAPLER RELOAD LINE PROX 30 GR (STAPLE)
STAPLER SKIN PROX 35W (STAPLE) ×3 IMPLANT
STAPLER VASCULAR ECHELON 35 (CUTTER) IMPLANT
STRIP CLOSURE SKIN 1/2X4 (GAUZE/BANDAGES/DRESSINGS) ×2 IMPLANT
SUT MNCRL 4-0 (SUTURE) ×6
SUT MNCRL 4-0 27XMFL (SUTURE) ×3
SUT MNCRL AB 3-0 PS2 27 (SUTURE) IMPLANT
SUT PROLENE 5 0 RB 1 DA (SUTURE) IMPLANT
SUT SILK 0 (SUTURE) ×2
SUT SILK 0 30XBRD TIE 6 (SUTURE) ×1 IMPLANT
SUT SILK 1 SH (SUTURE) ×21 IMPLANT
SUT VIC AB 0 CT1 36 (SUTURE) ×3 IMPLANT
SUT VIC AB 2-0 CT1 27 (SUTURE) ×4
SUT VIC AB 2-0 CT1 TAPERPNT 27 (SUTURE) ×2 IMPLANT
SUT VICRYL 2 TP 1 (SUTURE) ×3 IMPLANT
SUTURE MNCRL 4-0 27XMF (SUTURE) ×3 IMPLANT
SYR 10ML SLIP (SYRINGE) ×3 IMPLANT
SYR 20CC LL (SYRINGE) ×6 IMPLANT
SYR BULB IRRIG 60ML STRL (SYRINGE) ×3 IMPLANT
TAPE CLOTH 3X10 WHT NS LF (GAUZE/BANDAGES/DRESSINGS) ×3 IMPLANT
TAPE TRANSPORE STRL 2 31045 (GAUZE/BANDAGES/DRESSINGS) ×3 IMPLANT
TRAY FOLEY MTR SLVR 16FR STAT (SET/KITS/TRAYS/PACK) ×3 IMPLANT
TROCAR FLEXIPATH 20X80 (ENDOMECHANICALS) IMPLANT
TROCAR FLEXIPATH THORACIC 15MM (ENDOMECHANICALS) IMPLANT
TUBING CONNECTING 10 (TUBING) ×6 IMPLANT
TUBING CONNECTING 10' (TUBING) ×3
WATER STERILE IRR 1000ML POUR (IV SOLUTION) ×3 IMPLANT
YANKAUER SUCT BULB TIP FLEX NO (MISCELLANEOUS) ×6 IMPLANT

## 2017-10-08 NOTE — Progress Notes (Signed)
[facility] [phone] on patient status.

## 2017-10-08 NOTE — Progress Notes (Signed)
Checked with anesthesia about repeating urine pregnancy. Per Dr Sampson GoonFitzgerald do not need to repeat.

## 2017-10-08 NOTE — Progress Notes (Signed)
10/08/2017  Subjective: No acute events overnight.  Chest tube to suction and continues having an air leak.  Yesterday she reported acute onset of pain on both legs and U/S was negative for DVT.  Going to OR today.  Vital signs: Temp:  [97.8 F (36.6 C)-98.5 F (36.9 C)] 98.5 F (36.9 C) (09/23 1224) Pulse Rate:  [76-95] 90 (09/23 1224) Resp:  [17-18] 18 (09/23 1224) BP: (143-153)/(92-103) 146/103 (09/23 1224) SpO2:  [96 %-100 %] 96 % (09/23 1224)   Intake/Output: 09/22 0701 - 09/23 0700 In: 969 [P.O.:480; I.V.:489] Out: 24 [Chest Tube:24] Last BM Date: 10/08/17  Physical Exam: Constitutional: No acute distress Pulm:  Right chest tube in place, to suction, with air leak on plueravac.  No respiratory distress or use of accessory muscles  Labs:  Recent Labs    10/08/17 0535  WBC 6.4  HGB 12.8  HCT 37.7  PLT 236   Recent Labs    10/08/17 0535  NA 138  K 4.1  CL 106  CO2 27  GLUCOSE 100*  BUN 15  CREATININE 1.05*  CALCIUM 8.7*   No results for input(s): LABPROT, INR in the last 72 hours.  Imaging: [place] Venous Img Lower Bilateral  Result Date: 10/07/2017 CLINICAL DATA:  Bilateral pain EXAM: BILATERAL LOWER EXTREMITY VENOUS DOPPLER ULTRASOUND TECHNIQUE: Gray-scale sonography with compression, as well as color and duplex ultrasound, were performed to evaluate the deep venous system from the level of the common femoral vein through the popliteal and proximal calf veins. COMPARISON:  None FINDINGS: Normal compressibility of the common femoral, superficial femoral, and popliteal veins, as well as the proximal calf veins. No filling defects to suggest DVT on grayscale or color Doppler imaging. Doppler waveforms show normal direction of venous flow, normal respiratory phasicity and response to augmentation. IMPRESSION: No evidence of  lower extremity deep vein thrombosis. Electronically Signed   By: Corlis Leak  Hassell M.D.   On: 10/07/2017 17:05    Assessment/Plan: This is a 37 y.o.  female with right pneumothorax and CT findings of right middle lobar congenital emphysema.  --to OR today with Dr. Thelma Bargeaks for right thoracotomy and right middle lobectomy --Discussed case with patient as well and post-op outcomes and expectations for the rest of her hospital stay.   Howie IllJose Luis Mervin Ramires, MD [facility]

## 2017-10-08 NOTE — Anesthesia Procedure Notes (Signed)
Procedure Name: Intubation Date/Time: 10/08/2017 2:17 PM Performed by: Bernardo Heater, CRNA Pre-anesthesia Checklist: Patient identified, Emergency Drugs available, Suction available and Patient being monitored Patient Re-evaluated:Patient Re-evaluated prior to induction Oxygen Delivery Method: Circle system utilized Preoxygenation: Pre-oxygenation with 100% oxygen Induction Type: IV induction Laryngoscope Size: Mac and 3 Grade View: Grade I Endobronchial tube: Left, EBT position confirmed by fiberoptic bronchoscope and Double lumen EBT and 37 Fr Number of attempts: 1 Airway Equipment and Method: Stylet Secured at: 27 cm Tube secured with: Tape Dental Injury: Teeth and Oropharynx as per pre-operative assessment

## 2017-10-08 NOTE — Op Note (Signed)
10/01/2017 - 10/08/2017  5:10 PM  PATIENT:  Theresa Powell  37 y.o. female  PRE-OPERATIVE DIAGNOSIS: Right middle lobe cystic changes  POST-OPERATIVE DIAGNOSIS: Giant bulla disease right middle lobe  PROCEDURE: Preoperative bronchoscopy to assess endobronchial anatomy; right thoracotomy and excision of multiple giant bullae right middle lobe  SURGEON:  Surgeon(s) and Role:    * Henrene DodgePiscoya, Jose, MD - Primary    * Hulda Marinaks, Haniyyah Sakuma, MD - Assisting  ASSISTANTS:   ANESTHESIA: General  INDICATIONS FOR PROCEDURE this patient is a 37 year old woman who was recently admitted with a spontaneous pneumothorax.  A chest CT was performed revealing extensive bullous changes in the right middle lobe.  There is a concern that this may represent congenital lobar emphysema.  For this reason the patient was offered the above named procedure.  The indications and risks of surgery were explained the patient gave her informed consent.  DICTATION: Patient is brought to the operating suite and placed in the supine position.  General endotracheal anesthesia was given through a double-lumen tube.  Preoperative bronchoscopy was carried out.  The preoperative bronchoscopy  was normal to the subsegmental levels bilaterally.  Specifically the right middle lobe was widely patent and there is no evidence of tumor or obstruction.  The patient's endotracheal tube was appropriately positioned in the left mainstem bronchus and then the patient was turned for a right thoracotomy.  Our initial plan was to perform a right middle lobectomy.  The patient was prepped and draped in usual sterile fashion.  A muscle-sparing thoracotomy was performed.  The latissimus and serratus muscles were spared.  The chest was entered through approximately the fifth or sixth intercostal space.  Once the chest was entered we freed up the inferior pulmonary ligament.  There were extensive bolus changes in the surface of the right middle lobe.  The majority  of the right middle lobe however was normal.  There is no evidence of bullous disease within the middle lobe close to the fissures.  For this reason we elected only to perform a giant bullectomy.  Using multiple firings of the endoscopic stapler passed through access port inferiorly we were able to excise the portion of the right middle lobe containing all bleb disease.  We made certain that our stapling was through good tissue at the base of the diseased lung.  I would estimate that at least two thirds of the right middle lobe remain behind which ventilated very well.  Even with vigorous ventilation there were no other blebs identified in the middle lobe.  The fissure between the upper middle lobes was essentially nonexistent.  The fissure between the middle and lower lobes was complete.  We examined the lung for any other bullous disease and none was found.  Even with vigorous ventilation of the lung there were no other blebs identified.  We therefore decided to complete the procedure at this point.  Hemostasis was complete.  An angled and a straight 28 chest tube were placed in standard fashion.  The chest was irrigated.  Exparel was used for intercostal nerve blocks.  The chest was then closed in standard fashion using #2 Vicryl pericostal sutures.  The serratus and latissimus muscles were allowed to return to their normal anatomic position.  A 19 Blake was then placed in the subcutaneous tissues and brought out through a separate stab wound inferiorly.  Scarpa's fascia was then closed with a running 3-0 Vicryl and the skin was closed with a running 4-0 Monocryl.  All sponge  needle and instrument counts were correct as reported to me at the end of the case.  Sterile dressings were applied.  The patient was then rolled in the supine position where she was extubated and taken to the recovery room in stable condition.   Hulda Marin, MD

## 2017-10-08 NOTE — Transfer of Care (Signed)
Immediate Anesthesia Transfer of Care Note  Patient: Theresa Powell  Procedure(s) Performed: THORACOTOMY/ LOBECTOMY (N/A )  Patient Location: PACU  Anesthesia Type:General  Level of Consciousness: sedated  Airway & Oxygen Therapy: Patient Spontanous Breathing and Patient connected to face mask oxygen  Post-op Assessment: Report given to RN and Post -op Vital signs reviewed and stable  Post vital signs: Reviewed  Last Vitals:  Vitals Value Taken Time  BP    Temp    Pulse 86 10/08/2017  5:38 PM  Resp 23 10/08/2017  5:38 PM  SpO2 100 % 10/08/2017  5:38 PM  Vitals shown include unvalidated device data.  Last Pain:  Vitals:   10/08/17 1357  TempSrc:   PainSc: 5       Patients Stated Pain Goal: 0 (10/05/17 2124)  Complications: No apparent anesthesia complications

## 2017-10-08 NOTE — Anesthesia Post-op Follow-up Note (Signed)
Anesthesia QCDR form completed.        

## 2017-10-08 NOTE — Progress Notes (Signed)
  Patient ID: Theresa Powell, female   DOB: December 29, 1980, 37 y.o.   MRN: [id number]  HISTORY: This patient is a 37 year old African-American female who was in her usual state of health until last week when she was admitted with a spontaneous right-sided pneumothorax.  A chest tube was placed and she has had a persistent air leak ever since.  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  She has had a persistent air leak despite multiple attempts to manipulate the suction on her tube.  She states that she is not short of breath at the present time.  There is no family history of lobar emphysema and she does have 2 children who are alive and well at the ages of 247 and 2215 without any evidence of pulmonary dysfunction.   Vitals:   10/07/17 1224 10/07/17 2032  BP: 131/84 (!) 153/92  Pulse: 77 76  Resp: 19 17  Temp: 98.5 F (36.9 C) 97.8 F (36.6 C)  SpO2: 100% 100%     EXAM:    Resp: Lungs are clear bilaterally.  No respiratory distress, normal effort. Heart:  Regular without murmurs Abd:  Abdomen is soft, non distended and non tender. No masses are palpable.  There is no rebound and no guarding.  Neurological: Alert and oriented to person, place, and time. Coordination normal.  Skin: Skin is warm and dry. No rash noted. No diaphoretic. No erythema. No pallor.  Psychiatric: Normal mood and affect. Normal behavior. Judgment and thought content normal.    ASSESSMENT: I have independently reviewed her chest CT.  There is extensive emphysematous changes throughout the right middle lobe.   PLAN:   I had a long discussion with her today regarding the indications and risks of right thoracotomy with lung resection.  I told her that I felt that a middle lobectomy would most likely be required although multiple bullectomies may be attempted.  I reviewed with her the indications and risks including those of bleeding, infection, air leak and death.  Overall risks  were reviewed in detail and she expressed her understanding.  I also reviewed with her the potential risks associated with potential blood transfusions.  Risks of allergic reactions infectious complications including those of hepatitis and AIDS were all reviewed.  She understood.  After an extensive discussion with her she would like to proceed.  We will plan on right thoracotomy and lung resection later today.    Hulda Marinimothy Falyn Rubel, MDPatient ID: Theresa Powell, female   DOB: December 29, 1980, 37 y.o.   MRN: [id number]

## 2017-10-08 NOTE — Progress Notes (Signed)
Pt continues having unrelenting pain stating pain 8/10 after receiving 100mcg Fentanyl and 2mg  Dilaudid. Dr. Pernell DupreAdams notified. Acknowledged. Orders received. Marque Bango E 6:57 PM 10/08/2017

## 2017-10-08 NOTE — Anesthesia Preprocedure Evaluation (Addendum)
Anesthesia Evaluation  Patient identified by MRN, date of birth, ID band Patient awake    Reviewed: Allergy & Precautions, H&P , NPO status , Patient's Chart, lab work & pertinent test results, reviewed documented beta blocker date and time   Airway Mallampati: II  TM Distance: >3 FB Neck ROM: full    Dental   Pulmonary neg pulmonary ROS,    breath sounds clear to auscultation       Cardiovascular negative cardio ROS  + Valvular Problems/Murmurs  Rhythm:regular     Neuro/Psych  Headaches, negative psych ROS   GI/Hepatic negative GI ROS, Neg liver ROS, GERD  Medicated and Controlled,  Endo/Other  negative endocrine ROS  Renal/GU negative Renal ROS  negative genitourinary   Musculoskeletal   Abdominal   Peds  Hematology negative hematology ROS (+)   Anesthesia Other Findings See surgeon's H&P   Reproductive/Obstetrics negative OB ROS                            Anesthesia Physical  Anesthesia Plan  ASA: II  Anesthesia Plan: General   Post-op Pain Management:    Induction: Intravenous  PONV Risk Score and Plan:   Airway Management Planned: Double Lumen EBT  Additional Equipment:   Intra-op Plan:   Post-operative Plan: Extubation in OR  Informed Consent: I have reviewed the patients History and Physical, chart, labs and discussed the procedure including the risks, benefits and alternatives for the proposed anesthesia with the patient or authorized representative who has indicated his/her understanding and acceptance.   Dental Advisory Given  Plan Discussed with: CRNA and Surgeon  Anesthesia Plan Comments:        Anesthesia Quick Evaluation

## 2017-10-08 NOTE — Progress Notes (Signed)
Patient complaining of difficulty breathing. BP 146/103 T 98.5 P 90 R 90 on RA and Respirations 18. LS clear and patient is not symptomatic. Called Dr. Aleen CampiPiscoya and let him know the circumstances.

## 2017-10-09 ENCOUNTER — Encounter: Payer: Self-pay | Admitting: Surgery

## 2017-10-09 LAB — TYPE AND SCREEN
ABO/RH(D): B POS
Antibody Screen: NEGATIVE
Unit division: 0
Unit division: 0

## 2017-10-09 LAB — BPAM RBC
BLOOD PRODUCT EXPIRATION DATE: 201910212359
Blood Product Expiration Date: 201910192359
UNIT TYPE AND RH: 7300
Unit Type and Rh: 7300

## 2017-10-09 MED ORDER — MORPHINE SULFATE (PF) 4 MG/ML IV SOLN
4.0000 mg | Freq: Once | INTRAVENOUS | Status: AC
  Administered 2017-10-09: 4 mg via INTRAVENOUS
  Filled 2017-10-09: qty 1

## 2017-10-09 MED ORDER — ACETAMINOPHEN 500 MG PO TABS
1000.0000 mg | ORAL_TABLET | Freq: Four times a day (QID) | ORAL | Status: DC
  Administered 2017-10-09 – 2017-10-12 (×9): 1000 mg via ORAL
  Filled 2017-10-09 (×12): qty 2

## 2017-10-09 MED ORDER — ACETAMINOPHEN 500 MG PO TABS
1000.0000 mg | ORAL_TABLET | Freq: Once | ORAL | Status: AC
  Administered 2017-10-09: 1000 mg via ORAL
  Filled 2017-10-09: qty 2

## 2017-10-09 MED ORDER — HYDROMORPHONE HCL 1 MG/ML IJ SOLN
0.5000 mg | INTRAMUSCULAR | Status: DC | PRN
  Administered 2017-10-09 – 2017-10-10 (×3): 0.5 mg via INTRAVENOUS
  Filled 2017-10-09 (×4): qty 0.5

## 2017-10-09 MED ORDER — KETOROLAC TROMETHAMINE 15 MG/ML IJ SOLN
15.0000 mg | Freq: Once | INTRAMUSCULAR | Status: AC
  Administered 2017-10-09: 15 mg via INTRAVENOUS
  Filled 2017-10-09: qty 1

## 2017-10-09 MED ORDER — KETOROLAC TROMETHAMINE 15 MG/ML IJ SOLN
30.0000 mg | Freq: Four times a day (QID) | INTRAMUSCULAR | Status: DC
  Administered 2017-10-09 – 2017-10-10 (×5): 30 mg via INTRAVENOUS
  Filled 2017-10-09 (×5): qty 2

## 2017-10-09 MED ORDER — TRAMADOL HCL 50 MG PO TABS
100.0000 mg | ORAL_TABLET | Freq: Four times a day (QID) | ORAL | Status: DC
  Administered 2017-10-09 – 2017-10-12 (×12): 100 mg via ORAL
  Filled 2017-10-09 (×12): qty 2

## 2017-10-09 MED ORDER — OXYCODONE HCL 5 MG PO TABS
5.0000 mg | ORAL_TABLET | ORAL | Status: DC | PRN
  Administered 2017-10-09 – 2017-10-10 (×2): 5 mg via ORAL
  Filled 2017-10-09 (×2): qty 1

## 2017-10-09 MED ORDER — KETOROLAC TROMETHAMINE 15 MG/ML IJ SOLN: 30.0000 mg | Freq: Four times a day (QID) | INTRAMUSCULAR | Status: DC

## 2017-10-09 NOTE — Progress Notes (Signed)
Patient ID: Theresa Powell, Theresa Powell   DOB: 1980/05/31, 37 y.o.   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  Her biggest complaint has been postoperative pain.  She states that she was moved in bed today and that caused significant discomfort.  She is only used the oral narcotics once today but would prefer the intravenous morphine or Dilaudid.  We will fill appropriately changed her medications based upon her pain control.  She does not have an air leak today.  She does have slightly diminished breath sounds at the right with a normal exam on the left.  There is some splinting on the right.  Her Jackson-Pratt drain is serosanguineous.  The chest tubes are likewise serosanguineous.  Her incision is clean dry and intact.  We will go ahead and make appropriate adjustments to her medications.  I did encourage her to try to eat regular food and also to try to spend some time walking and being out of bed.  I think this might help her overall discomfort some.  Pathology is still pending.

## 2017-10-09 NOTE — Progress Notes (Signed)
Patient had complained of pain 10/10 during the night after receiving Morphine, Norco, Tramadol, Tylenol IV and Toradol. Dr. Earlene Plateravis has been notified and he has increased dose of Morphine to 4mg . Patient spoke with nursing supervisor Judeth CornfieldStephanie. Patient states she is finally feeling relief. Will continue to monitor patient.

## 2017-10-10 ENCOUNTER — Inpatient Hospital Stay: Payer: BLUE CROSS/BLUE SHIELD

## 2017-10-10 LAB — PREPARE RBC (CROSSMATCH)

## 2017-10-10 LAB — SURGICAL PATHOLOGY

## 2017-10-10 MED ORDER — ALBUTEROL SULFATE (2.5 MG/3ML) 0.083% IN NEBU
2.5000 mg | INHALATION_SOLUTION | Freq: Two times a day (BID) | RESPIRATORY_TRACT | Status: DC
  Administered 2017-10-10: 2.5 mg via RESPIRATORY_TRACT
  Filled 2017-10-10: qty 3

## 2017-10-10 MED ORDER — ONDANSETRON HCL 4 MG/2ML IJ SOLN
4.0000 mg | Freq: Four times a day (QID) | INTRAMUSCULAR | Status: DC | PRN
  Administered 2017-10-10 – 2017-10-11 (×2): 4 mg via INTRAVENOUS
  Filled 2017-10-10 (×2): qty 2

## 2017-10-10 MED ORDER — ONDANSETRON HCL 4 MG/2ML IJ SOLN
4.0000 mg | Freq: Once | INTRAMUSCULAR | Status: AC
  Administered 2017-10-10: 4 mg via INTRAVENOUS
  Filled 2017-10-10: qty 2

## 2017-10-10 MED ORDER — KETOROLAC TROMETHAMINE 30 MG/ML IJ SOLN
30.0000 mg | Freq: Four times a day (QID) | INTRAMUSCULAR | Status: DC
  Administered 2017-10-10 – 2017-10-12 (×7): 30 mg via INTRAVENOUS
  Filled 2017-10-10 (×7): qty 1

## 2017-10-10 MED ORDER — GABAPENTIN 300 MG PO CAPS
300.0000 mg | ORAL_CAPSULE | Freq: Three times a day (TID) | ORAL | Status: DC
  Administered 2017-10-10 – 2017-10-12 (×5): 300 mg via ORAL
  Filled 2017-10-10 (×5): qty 1

## 2017-10-10 MED ORDER — POLYETHYLENE GLYCOL 3350 17 G PO PACK: 17.0000 g | PACK | Freq: Every day | ORAL | Status: DC | PRN

## 2017-10-10 MED ORDER — HYDROMORPHONE HCL 1 MG/ML IJ SOLN
0.5000 mg | Freq: Once | INTRAMUSCULAR | Status: AC
  Administered 2017-10-10: 0.5 mg via INTRAVENOUS
  Filled 2017-10-10: qty 0.5

## 2017-10-10 MED ORDER — HYDROMORPHONE HCL 1 MG/ML IJ SOLN
1.0000 mg | INTRAMUSCULAR | Status: DC | PRN
  Administered 2017-10-10: 1 mg via INTRAVENOUS
  Filled 2017-10-10: qty 1

## 2017-10-10 MED ORDER — OXYCODONE-ACETAMINOPHEN 5-325 MG PO TABS
1.0000 | ORAL_TABLET | ORAL | Status: DC | PRN
  Administered 2017-10-10 – 2017-10-11 (×3): 2 via ORAL
  Filled 2017-10-10 (×4): qty 2

## 2017-10-10 MED ORDER — ALBUTEROL SULFATE (2.5 MG/3ML) 0.083% IN NEBU
2.5000 mg | INHALATION_SOLUTION | Freq: Two times a day (BID) | RESPIRATORY_TRACT | Status: DC
  Administered 2017-10-11: 2.5 mg via RESPIRATORY_TRACT
  Filled 2017-10-10 (×2): qty 3

## 2017-10-10 NOTE — Progress Notes (Signed)
10/10/2017  Subjective: Patient is 2 Days Post-Op s/p right thoracotomy with partial right middle lobectomy.  Her first postop day had a lot of pain and discomfort but today she is doing much better as her medications were changed.  Denies any fevers or chills.  Pain is better controlled.  Vital signs: Temp:  [97.5 F (36.4 C)-98.2 F (36.8 C)] 98.1 F (36.7 C) (09/25 0901) Pulse Rate:  [70-85] 85 (09/25 0901) Resp:  [16-24] 24 (09/25 0428) BP: (114-138)/(80-98) 114/80 (09/25 0901) SpO2:  [96 %-99 %] 98 % (09/25 0901)   Intake/Output: 09/24 0701 - 09/25 0700 In: 3035 [P.O.:240; I.V.:1295] Out: 180 [Drains:30; Chest Tube:150] Last BM Date: 10/08/17  Physical Exam: Constitutional: No acute distress Pulm:  Two right chest tubes in place with no air leak on pleuravac and with serosanguinous fluid.  Right subcutaneous drain with serosanguinous fluid as well.  Incisions healing well, clean, dry, intact.   Labs:  Recent Labs    10/08/17 0535 10/08/17 1318  WBC 6.4 5.8  HGB 12.8 13.5  HCT 37.7 39.5  PLT 236 255   Recent Labs    10/08/17 0535 10/08/17 1318  NA 138 138  K 4.1 3.9  CL 106 107  CO2 27 24  GLUCOSE 100* 97  BUN 15 13  CREATININE 1.05* 0.96  CALCIUM 8.7* 9.1   Recent Labs    10/08/17 1318  LABPROT 12.4  INR 0.93    Imaging: CXR today showing lungs expanded and chest tubes in appropriate position.  Assessment/Plan: This is a 37 y.o. female s/p right thoracotomy and partial right middle lobectomy.  --Discussed with Dr. Thelma Barge.  He will follow her as I will not be available starting tomorrow.  Chest tubes going to waterseal today.   --OOB, ambulate --DVT prophylaxis   Howie Ill, MD [facility]

## 2017-10-10 NOTE — Evaluation (Signed)
Physical Therapy Evaluation Patient Details Name: Theresa PeerLaquita Fennel MRN: [id number] DOB: February 03, 1980 Today's Date: 10/10/2017   History of Present Illness  Theresa Powell is a 37 y.o. female who presents with chest pain and shortness of breath. She notes that on 09/29/17 she was at a fair and on a "zero-ravity" ride when she felt a sudden onset of sharp chest pain in the right center of her chest and shortness of breath. A chest tube was placed and then she experienced persistent air leak.  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 No significant PMH with pt in relative good health prior to this admission.   Clinical Impression  Pt with good effort and willingness to work with PT this date. Pt demonstrated independence with all bed mobility, basic transfers, and ambulation with only supervision during session for safety. Pt demonstrated functional strength during MMT, but pt reporting her strength feels diminished as compared to before her admission. Pt able to ambulate 7 stairs with 1 UE hold on the rail with a step to pattern. No LOB or instabilities noted during stairs and pt feeling confident and safe throughout examination. HR remaining in mid 80's and SpO2 remaining in mid to high 90's throughout evaluation. Overall pt is doing well functionally and her mobility is independent. Due to a lack of mobility deficits, pt does not require continued PT services at this time and is most appropriate to return home following medical clearance.      Follow Up Recommendations No PT follow up    Equipment Recommendations       Recommendations for Other Services       Precautions / Restrictions Precautions Precautions: None Precaution Comments: Chest tube on R Restrictions Weight Bearing Restrictions: No      Mobility  Bed Mobility Overal bed mobility: Independent                 Transfers Overall transfer level: Modified independent   Transfers: Sit to/from Stand Sit to Stand: Modified independent (Device/Increase time)            Ambulation/Gait Ambulation/Gait assistance: Modified independent (Device/Increase time)(supervision for safety, but no assistance needed) Gait Distance (Feet): 200 Feet Assistive device: None       General Gait Details: No increase in pain during walking; pt reporting no respiratory distress during ambulation; decreased gait speed; no deviations or imbalances during   Stairs Stairs: Yes Stairs assistance: Supervision Stair Management: One rail Left Number of Stairs: 7 General stair comments: Step to pattern; increased time required; no LOB or physical assist needed  Wheelchair Mobility    Modified Rankin (Stroke Patients Only)       Balance Overall balance assessment: Independent                                           Pertinent Vitals/Pain Pain Assessment: 0-10 Pain Score: 6  Pain Location: R chest  Pain Descriptors / Indicators: Discomfort;Grimacing;Aching;Sharp Pain Intervention(s): Monitored during session;Premedicated before session;Repositioned    Home Living Family/patient expects to be discharged to:: Private residence Living Arrangements: Other relatives Available Help at Discharge: Family Type of Home: Apartment Home Access: Stairs to enter Entrance Stairs-Rails: Left Entrance Stairs-Number of Steps: 12 Home Layout: One level Home Equipment: None      Prior Function  Level of Independence: Independent               Hand Dominance        Extremity/Trunk Assessment   Upper Extremity Assessment Upper Extremity Assessment: Overall WFL for tasks assessed    Lower Extremity Assessment Lower Extremity Assessment: Overall WFL for tasks assessed       Communication   Communication: No difficulties  Cognition Arousal/Alertness: Awake/alert Behavior During  Therapy: WFL for tasks assessed/performed Overall Cognitive Status: Within Functional Limits for tasks assessed                                        General Comments      Exercises     Assessment/Plan    PT Assessment Patent does not need any further PT services  PT Problem List         PT Treatment Interventions      PT Goals (Current goals can be found in the Care Plan section)       Frequency     Barriers to discharge        Co-evaluation               AM-PAC PT "6 Clicks" Daily Activity  Outcome Measure Difficulty turning over in bed (including adjusting bedclothes, sheets and blankets)?: None Difficulty moving from lying on back to sitting on the side of the bed? : None Difficulty sitting down on and standing up from a chair with arms (e.g., wheelchair, bedside commode, etc,.)?: None Help needed moving to and from a bed to chair (including a wheelchair)?: None Help needed walking in hospital room?: None Help needed climbing 3-5 steps with a railing? : None 6 Click Score: 24    End of Session   Activity Tolerance: Patient tolerated treatment well Patient left: in chair;with call bell/phone within reach Nurse Communication: Mobility status PT Visit Diagnosis: Pain Pain - Right/Left: Right Pain - part of body: (chest)    Time: 4098-1191 PT Time Calculation (min) (ACUTE ONLY): 38 min   Charges:              Mickel Duhamel, SPT 10/10/2017, 12:48 PM

## 2017-10-10 NOTE — Progress Notes (Signed)
Patient ID: Theresa Powell, female   DOB: March 05, 1980, 37 y.o.   MRN: [id number]   Today she states that her pain is under much better control.  She was able to walk to the bathroom without significant discomfort.  She has no air leak today with a vigorous cough.  Her lungs are clear bilaterally.  There is some diminution of her respiratory effort however.  The Jackson-Pratt drain is draining only minimal amounts of serosanguineous fluid.  I will place her chest tubes to waterseal today.  We did change all of her dressings today.  Her wounds are clean and dry.  I have discontinued her Toradol.  We will continue to monitor her pain and encourage her to be out of bed and ambulate as much as possible today.

## 2017-10-11 ENCOUNTER — Inpatient Hospital Stay: Payer: BLUE CROSS/BLUE SHIELD

## 2017-10-11 NOTE — Progress Notes (Signed)
Pt refused 2000 hr tx, stated she was feeling sick and did not feel like her taking her tx at this time.

## 2017-10-12 ENCOUNTER — Inpatient Hospital Stay: Payer: BLUE CROSS/BLUE SHIELD

## 2017-10-12 MED ORDER — OXYCODONE HCL 5 MG PO TABS: 5.0000 mg | ORAL_TABLET | Freq: Four times a day (QID) | ORAL | 0 refills | Status: DC | PRN

## 2017-10-12 MED ORDER — METHOCARBAMOL 500 MG PO TABS: 500.0000 mg | ORAL_TABLET | Freq: Four times a day (QID) | ORAL | 0 refills | Status: DC | PRN

## 2017-10-12 NOTE — Progress Notes (Signed)
10/12/2017 1500  Theresa Powell to be D/C'd Home per MD order.  Discussed prescriptions and follow up appointments with the patient. Prescriptions given to patient, medication list explained in detail. Pt verbalized understanding.  Allergies as of 10/12/2017      Reactions   Hydrocodone-acetaminophen Nausea Only, Other (See Comments)   Made patient dizzy/light headed after taken.      Medication List    TAKE these medications   acetaminophen 500 MG tablet Commonly known as:  TYLENOL Take 500 mg by mouth every 6 (six) hours as needed.   ergocalciferol 50000 units capsule Commonly known as:  VITAMIN D2 Take 50,000 Units by mouth once a week.   methocarbamol 500 MG tablet Commonly known as:  ROBAXIN Take 1 tablet (500 mg total) by mouth every 6 (six) hours as needed for muscle spasms (take as needed for muscle spasms).   oxyCODONE 5 MG immediate release tablet Commonly known as:  Oxy IR/ROXICODONE Take 1 tablet (5 mg total) by mouth every 6 (six) hours as needed for severe pain.   traMADol 50 MG tablet Commonly known as:  ULTRAM Take 1 tablet (50 mg total) by mouth every 6 (six) hours as needed for pain. Maximum dose= 8 tablets per day            Discharge Care Instructions  (From admission, onward)         Start     Ordered   10/12/17 0000  Change dressing (specify)    Comments:  Dressing change: Change dressing every other day   10/12/17 1300          Vitals:   10/11/17 1247 10/11/17 2015  BP: (!) 136/100 (!) 156/91  Pulse: 75 72  Resp: 16 18  Temp: 98.6 F (37 C) (!) 97.5 F (36.4 C)  SpO2: 100% 100%    Skin clean, dry and intact without evidence of skin break down, no evidence of skin tears noted. IV catheter discontinued intact. Site without signs and symptoms of complications. Dressing and pressure applied. Pt denies pain at this time. No complaints noted.  An After Visit Summary was printed and given to the patient. Patient escorted via WC, and D/C  home via private auto.  Theresa Powell

## 2017-10-12 NOTE — Progress Notes (Signed)
Patient ID: Theresa Powell, female   DOB: 10-25-1980, 37 y.o.   MRN: [id number]  No specific complaints today.  She had a good evening.  She tolerated removal of her chest tube yesterday without difficulty.  The chest tube is drained about 80 cc in the last 24 hours.  There is no air leak.  Her chest x-ray from today shows the lungs to be well expanded without evidence of pneumothorax or pleural effusion.  Her lungs are equal bilaterally and clear.  Her heart is regular.  Her thoracotomy wound is well approximated without erythema or drainage.  I did remove her single remaining chest tube.  That was performed without incident.  We will repeat her chest x-ray now.  I did reviewed with her the management of her subcutaneous Jackson-Pratt drain.  This should stay in place until its less than 30 cc/day.  She was instructed to change the bulb every 8 hours and record this on a slip of paper and to contact my office Monday morning.  If the drainage is less than 30 cc/day we will remove the tube.  She knows that she cannot get her wounds wet.  She should not drive a car for 1 month and should not lift anything heavier than 10 pounds for 1 month.  I did review with her the results of the pathology showing bullous emphysema.  All of her questions were answered.

## 2017-10-12 NOTE — Discharge Summary (Signed)
Discharge Summary  Patient ID: Theresa Powell MRN: [id number] DOB/AGE: August 17, 1980 37 y.o.  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 female who presents to the ED with chest pain and shortness of breath. She notes that on Saturday she was at a fair and on a "zero-ravity" ride when she felt a sudden onset of sharp chest pain in the right center of her chest and shortness of breath. The pain lasted for a few minutes and gradually improved. Yesterday, she was noticing intermittent sharp pain in the right side of her chest worse with laying down. She went to her PCP this morning for this who preformed a CXR and was found to have a PTX and sent her to the ED. She continues to notice mild SOB and CP. She denied any recent trauma. She denied any additional symptoms and this has never happened to her in the past. Non-smoker.    Hospital Course: A chest tube was placed in the ED on day of admission and was placed to 20 cm wall suction. Over the course of the nest week, Theresa Powell continued to have recurrent right sided pneumothorax despite suction vs water seal. On 09/21, she had a CT of her chest which revealed right middle lobe congenital emphysema. Dr. Thelma Barge, cardiothoracic surgery was consulted and planned for right middle lobectomy. She underwent right middle lobectomy on 09/23 with Dr. Thelma Barge. During her post-operative period she had difficulty with pain, but she did not have a recurrent PTX. A chest tube was removed on 09/26 and the second on 09/27. He JP drain remained in place throughout the post-operative period and she will be discharged with this. She continued to mobilize and tolerate a diet  throughout the admission. On the day of discharge (10/12/17), she was tolerating a diet, mobilizing well, and her pain was adequately controlled. She is to follow up with Dr. Thelma Barge in 1 week for JP drain removal. She understands all of her discharge instructions. All of her questions and concerns were addressed and answered.     Allergies as of 10/12/2017      Reactions   Hydrocodone-acetaminophen Nausea Only, Other (See Comments)   Made patient dizzy/light headed after taken.      Medication List    TAKE these medications   acetaminophen 500 MG tablet Commonly known as:  TYLENOL Take 500 mg by mouth every 6 (six) hours as needed.   ergocalciferol 50000 units capsule Commonly known as:  VITAMIN D2 Take 50,000 Units by mouth once a week.   methocarbamol 500 MG tablet Commonly known as:  ROBAXIN Take 1 tablet (500 mg total) by mouth every 6 (six) hours as needed for muscle spasms (take as needed for muscle spasms).   oxyCODONE 5 MG immediate release tablet Commonly known as:  Oxy IR/ROXICODONE Take 1 tablet (5 mg total) by mouth every 6 (six) hours as needed for severe pain.   traMADol 50 MG tablet Commonly known as:  ULTRAM Take 1 tablet (50 mg total) by mouth every 6 (six) hours as needed for pain. Maximum dose= 8 tablets per day            Discharge Care Instructions  (From admission, onward)         Start  Ordered   10/12/17 0000  Change dressing (specify)    Comments:  Dressing change: Change dressing every other day   10/12/17 1300           Follow-up Information    Hulda Marin, MD. Go on 10/19/2017.   Specialties:  Cardiothoracic Surgery, General Surgery Why:  1 week follow up with dr. Thelma Barge...s/p right thoracotomy and right middle lobectomy She should call the office on Monday to discuss amount draining from JP drain Contact information: [address] [place] [address] [phone]           Signed: Lynden Oxford ,  PA-C [facility]  10/12/2017, 3:41 PM [phone] M-F: 7am - 4pm

## 2017-10-12 NOTE — Progress Notes (Signed)
Patient ID: Charlize Hathaway, female   DOB: 1980-12-12, 37 y.o.   MRN: [id number]   Late Note Entry for 10/11/17  I did have the opportunity to see this patient on 10/11/2017 and this note reflects the services I provided that patient on that day.  Her pain is under much better control today.  She states that she has some discomfort with coughing.  She does not have an air leak today with vigorous coughing.  Her chest x-ray was independently reviewed.  There is no evidence of a pneumothorax.  Her lungs are equal bilaterally.  Her heart is regular.  Her thoracotomy wound is well approximated without erythema or drainage.  There is no air leak from the system.  I removed her angled posterior chest tube today without difficulty.  We will repeat her chest x-ray in the morning and remove the anterior tube if there is no evidence of a pneumothorax or air leak.  She was encouraged to ambulate in the halls.  We will adjust her pain medications appropriately.

## 2017-10-15 ENCOUNTER — Telehealth: Payer: Self-pay | Admitting: *Deleted

## 2017-10-15 ENCOUNTER — Other Ambulatory Visit: Payer: Self-pay

## 2017-10-15 DIAGNOSIS — J9383 Other pneumothorax: Secondary | ICD-10-CM

## 2017-10-15 NOTE — Telephone Encounter (Signed)
Patient has called again asking for a post op appt with Dr Vivien Presto was advised to call this morning to provide the JP Drain amounts and to make an appt to have drain removed if less than 30mL.   Looking at Dr Thelma Barge' schedule, he is not scheduled to be in the office till 10/22/17 AM and 10/26/17 AM. Please advise patient of appt date and discuss drain amounts.

## 2017-10-15 NOTE — Telephone Encounter (Signed)
Patient added to 10/16/17 @ 11 am per Dr.Oaks. Patient understands to have chest xray prior.

## 2017-10-15 NOTE — Telephone Encounter (Signed)
Patient called to let you know about her JP Drain amounts over the weekend.  [facility] October 28th: 12pm: 20mL 8pm: 8mL  Sunday October 29th 10am: 8mL 8pm:60mL  As of this morning it was no drainage.

## 2017-10-16 ENCOUNTER — Ambulatory Visit (INDEPENDENT_AMBULATORY_CARE_PROVIDER_SITE_OTHER): Payer: BLUE CROSS/BLUE SHIELD | Admitting: Cardiothoracic Surgery

## 2017-10-16 ENCOUNTER — Ambulatory Visit
Admission: RE | Admit: 2017-10-16 | Discharge: 2017-10-16 | Disposition: A | Discharge status: Home / Self Care | Payer: BLUE CROSS/BLUE SHIELD | Source: Ambulatory Visit | Attending: Cardiothoracic Surgery | Admitting: Cardiothoracic Surgery

## 2017-10-16 DIAGNOSIS — J9383 Other pneumothorax: Secondary | ICD-10-CM | POA: Diagnosis present

## 2017-10-16 DIAGNOSIS — J90 Pleural effusion, not elsewhere classified: Secondary | ICD-10-CM | POA: Diagnosis not present

## 2017-10-16 DIAGNOSIS — J9811 Atelectasis: Secondary | ICD-10-CM | POA: Insufficient documentation

## 2017-10-16 NOTE — Progress Notes (Signed)
She returns today in follow-up.  She is now about 1 week out from a right thoracotomy and giant bullectomy.  She states that she is done well.  She has occasional discomfort when she lies back totally flat.  Otherwise she has no shortness of breath.  She does have some numbness and tingling underneath her right breast.  Her Blake drain which is in the subcutaneous space drained 10 cc over the last 21 hours.  We therefore removed that.  Her wounds are all clean and dry.  Her sutures were removed from her chest tube sites.  Her lungs are clear and her heart is regular.  Her chest x-ray was independently reviewed by me and shows no pleural effusion or pneumothorax.  I did discuss with her the opportunity to come back in 4 weeks for further follow-up.  She will come back sooner if she desires to go back to work.  All of her questions were answered.

## 2017-10-16 NOTE — Patient Instructions (Addendum)
If you decide to return to work before your next scheduled appointment call our office to schedule an appointment sooner.   Please keep a dressing over the area until drain site heals completely.

## 2017-10-16 NOTE — Anesthesia Postprocedure Evaluation (Signed)
Anesthesia Post Note  Patient: Theresa Powell  Procedure(s) Performed: THORACOTOMY/ LOBECTOMY (N/A )  Patient location during evaluation: PACU Anesthesia Type: General Level of consciousness: awake and alert Pain management: pain level controlled Vital Signs Assessment: post-procedure vital signs reviewed and stable Respiratory status: spontaneous breathing, nonlabored ventilation, respiratory function stable and patient connected to nasal cannula oxygen Cardiovascular status: blood pressure returned to baseline and stable Postop Assessment: no apparent nausea or vomiting Anesthetic complications: no     Last Vitals:  Vitals:   10/11/17 1247 10/11/17 2015  BP: (!) 136/100 (!) 156/91  Pulse: 75 72  Resp: 16 18  Temp: 37 C (!) 36.4 C  SpO2: 100% 100%    Last Pain:  Vitals:   10/12/17 0742  TempSrc:   PainSc: 5                  Yevette Edwards

## 2017-10-30 ENCOUNTER — Telehealth: Payer: Self-pay | Admitting: *Deleted

## 2017-10-30 DIAGNOSIS — J90 Pleural effusion, not elsewhere classified: Secondary | ICD-10-CM

## 2017-10-30 MED ORDER — OXYCODONE HCL 5 MG PO TABS: 5.0000 mg | ORAL_TABLET | Freq: Four times a day (QID) | ORAL | 0 refills | Status: DC | PRN

## 2017-10-30 NOTE — Telephone Encounter (Signed)
Called Dr. Thelma Barge to ask for a refill on patient's Oxycodone. However, Dr. Thelma Barge stated that he would call me back.

## 2017-10-30 NOTE — Telephone Encounter (Signed)
Called patient to let her know that Dr. Thelma Barge will be able to give her a refill on her Oxycodone and that it will be left at the front desk. Patient understood and had no further questions.

## 2017-10-30 NOTE — Telephone Encounter (Signed)
Patient called and wanted to know if she can get another refill of oxycodone. Please call and advise

## 2017-11-08 DIAGNOSIS — J930 Spontaneous tension pneumothorax: Secondary | ICD-10-CM

## 2017-11-09 ENCOUNTER — Other Ambulatory Visit: Payer: Self-pay

## 2017-11-09 DIAGNOSIS — J9383 Other pneumothorax: Secondary | ICD-10-CM

## 2017-11-12 ENCOUNTER — Encounter: Payer: Self-pay | Admitting: Cardiothoracic Surgery

## 2017-11-12 ENCOUNTER — Encounter: Payer: Self-pay | Admitting: *Deleted

## 2017-11-12 ENCOUNTER — Ambulatory Visit (INDEPENDENT_AMBULATORY_CARE_PROVIDER_SITE_OTHER): Payer: BLUE CROSS/BLUE SHIELD | Admitting: Cardiothoracic Surgery

## 2017-11-12 ENCOUNTER — Ambulatory Visit
Admission: RE | Admit: 2017-11-12 | Discharge: 2017-11-12 | Disposition: A | Discharge status: Home / Self Care | Payer: BLUE CROSS/BLUE SHIELD | Source: Ambulatory Visit | Attending: Cardiothoracic Surgery | Admitting: Cardiothoracic Surgery

## 2017-11-12 VITALS — BP 155/99 | HR 71 | Temp 97.2°F | Resp 13 | Ht 66.0 in | Wt 190.0 lb

## 2017-11-12 DIAGNOSIS — J9819 Other pulmonary collapse: Secondary | ICD-10-CM | POA: Insufficient documentation

## 2017-11-12 DIAGNOSIS — Z902 Acquired absence of lung [part of]: Secondary | ICD-10-CM

## 2017-11-12 DIAGNOSIS — J90 Pleural effusion, not elsewhere classified: Secondary | ICD-10-CM | POA: Diagnosis not present

## 2017-11-12 DIAGNOSIS — J9383 Other pneumothorax: Secondary | ICD-10-CM

## 2017-11-12 DIAGNOSIS — J439 Emphysema, unspecified: Secondary | ICD-10-CM | POA: Insufficient documentation

## 2017-11-12 DIAGNOSIS — Z9889 Other specified postprocedural states: Secondary | ICD-10-CM

## 2017-11-12 MED ORDER — IOPAMIDOL (ISOVUE-300) INJECTION 61%
75.0000 mL | Freq: Once | INTRAVENOUS | Status: AC | PRN
  Administered 2017-11-12: 75 mL via INTRAVENOUS

## 2017-11-12 NOTE — Patient Instructions (Signed)
CT scan of the chest today

## 2017-11-12 NOTE — Progress Notes (Signed)
Patient has been scheduled for a CT chet with contrast for today, 11-12-17. The patient was sent from our office to [facility] to have this completed. Patient is aware nothing else to eat or drink. She will be contacted with results.   Referral has also been made for the patient to be seen with Dr. Santiago Glad as soon as possible (per Dr. Thelma Barge). Sabrina at Dr. Clovis Fredrickson office is aware of the referral and their office will be contacting patient directly to arrange.   The patient is aware of the above and verbalizes understanding.

## 2017-11-12 NOTE — Progress Notes (Signed)
  Patient ID: Theresa Powell, female   DOB: August 24, 1980, 37 y.o.   MRN: [id number]  HISTORY: She did very well until several days ago when she experienced some right-sided chest discomfort associated with a cough.  She states that whenever she bends over she has to cough.  Her cough is quite vigorous and is nonproductive.  She had a chest x-ray made today which I have independently reviewed and I subsequently discussed her care with our radiologist and pulmonologist.   Vitals:   11/12/17 0834  BP: (!) 155/99  Pulse: 71  Resp: 13  Temp: (!) 97.2 F (36.2 C)  SpO2: 98%     EXAM:    Resp: Lungs are clear except at the right base.  No respiratory distress, normal effort. Heart:  Regular without murmurs Abd:  Abdomen is soft, non distended and non tender. No masses are palpable.  There is no rebound and no guarding.  Neurological: Alert and oriented to person, place, and time. Coordination normal.  Skin: Skin is warm and dry. No rash noted. No diaphoretic. No erythema. No pallor.  Psychiatric: Normal mood and affect. Normal behavior. Judgment and thought content normal.    ASSESSMENT: I have discussed her chest x-ray findings with our radiologist and with Dr. Gabriel Earing and pulmonary medicine.  It is thought that this likely represents some partial right lower lobe collapse with an associated effusion.  I discussed the patient with Dr. Nicanor Bake would like to obtain a CT scan and see the patient his office.   PLAN:   I will obtain a CT scan of the chest today.  I will refer the patient to Dr. Gabriel Earing for potential bronchoscopy.    Hulda Marin, MD

## 2017-11-13 ENCOUNTER — Encounter: Payer: Self-pay | Admitting: Internal Medicine

## 2017-11-13 ENCOUNTER — Ambulatory Visit
Admission: RE | Admit: 2017-11-13 | Discharge: 2017-11-13 | Disposition: A | Discharge status: Home / Self Care | Payer: BLUE CROSS/BLUE SHIELD | Source: Ambulatory Visit | Attending: Internal Medicine | Admitting: Internal Medicine

## 2017-11-13 ENCOUNTER — Ambulatory Visit
Admission: RE | Admit: 2017-11-13 | Discharge: 2017-11-13 | Disposition: A | Discharge status: Home / Self Care | Payer: BLUE CROSS/BLUE SHIELD | Source: Ambulatory Visit | Attending: Interventional Radiology | Admitting: Interventional Radiology

## 2017-11-13 ENCOUNTER — Ambulatory Visit (INDEPENDENT_AMBULATORY_CARE_PROVIDER_SITE_OTHER): Payer: BLUE CROSS/BLUE SHIELD | Admitting: Internal Medicine

## 2017-11-13 VITALS — BP 132/82 | HR 95 | Resp 16 | Ht 66.0 in | Wt 189.0 lb

## 2017-11-13 DIAGNOSIS — J90 Pleural effusion, not elsewhere classified: Secondary | ICD-10-CM | POA: Insufficient documentation

## 2017-11-13 DIAGNOSIS — Z9889 Other specified postprocedural states: Secondary | ICD-10-CM | POA: Diagnosis present

## 2017-11-13 LAB — ALBUMIN, PLEURAL OR PERITONEAL FLUID: Albumin, Fluid: 3.8 g/dL

## 2017-11-13 LAB — AMYLASE, PLEURAL OR PERITONEAL FLUID: Amylase, Fluid: 49 U/L

## 2017-11-13 LAB — BODY FLUID CELL COUNT WITH DIFFERENTIAL
EOS FL: 66 %
Lymphs, Fluid: 3 %
MONOCYTE-MACROPHAGE-SEROUS FLUID: 21 %
Neutrophil Count, Fluid: 3 %
OTHER CELLS FL: 7 %
Total Nucleated Cell Count, Fluid: 3247 cu mm

## 2017-11-13 LAB — GLUCOSE, PLEURAL OR PERITONEAL FLUID: Glucose, Fluid: 93 mg/dL

## 2017-11-13 LAB — PROTEIN, PLEURAL OR PERITONEAL FLUID: Total protein, fluid: 5.9 g/dL

## 2017-11-13 LAB — LACTATE DEHYDROGENASE, PLEURAL OR PERITONEAL FLUID: LD, Fluid: 350 U/L — ABNORMAL HIGH (ref 3–23)

## 2017-11-13 NOTE — Discharge Instructions (Signed)
Thoracentesis, Care After °Refer to this sheet in the next few weeks. These instructions provide you with information about caring for yourself after your procedure. Your health care provider may also give you more specific instructions. Your treatment has been planned according to current medical practices, but problems sometimes occur. Call your health care provider if you have any problems or questions after your procedure. °What can I expect after the procedure? °After your procedure, it is common to have pain at the puncture site. °Follow these instructions at home: °· Take medicines only as directed by your health care provider. °· You may return to your normal diet and normal activities as directed by your health care provider. °· Drink enough fluid to keep your urine clear or pale yellow. °· Do not take baths, swim, or use a hot tub until your health care provider approves. °· Follow your health care provider's instructions about: °? Puncture site care. °? Bandage (dressing) changes and removal. °· Check your puncture site every day for signs of infection. Watch for: °? Redness, swelling, or pain. °? Fluid, blood, or pus. °· Keep all follow-up visits as directed by your health care provider. This is important. °Contact a health care provider if: °· You have redness, swelling, or pain at your puncture site. °· You have fluid, blood, or pus coming from your puncture site. °· You have a fever. °· You have chills. °· You have nausea or vomiting. °· You have trouble breathing. °· You develop a worsening cough. °Get help right away if: °· You have extreme shortness of breath. °· You develop chest pain. °· You faint or feel light-headed. °This information is not intended to replace advice given to you by your health care provider. Make sure you discuss any questions you have with your health care provider. °Document Released: 01/23/2014 Document Revised: 09/04/2015 Document Reviewed: 10/14/2013 °Elsevier  Interactive Patient Education © 2018 [employer] ° °

## 2017-11-13 NOTE — Patient Instructions (Signed)
Plan for US guided Thoracentesis today

## 2017-11-13 NOTE — Progress Notes (Signed)
Name: Theresa Powell MRN: [id number] DOB: August 03, 1980     CONSULTATION DATE: 10.29.19 REFERRING MD : Thelma Barge  CHIEF COMPLAINT: SOB  STUDIES:     10/16/17 CXR independently reviewed by Me today  RT sided opacity consistent with right-sided pleural effusion and atelectasis     11/12/17 CT chest Independently reviewed by Me today Moderate to large right-sided pleural effusion with right lower lobe lung collapse with atelectasis  HISTORY OF PRESENT ILLNESS: 37 year old pleasant African-American female seen today for abnormal chest x-ray and abnormal CT scan with pleural effusion  Approximately 1 month ago patient was admitted for spontaneous pneumothorax several days prior to that patient was on a roller coaster ride called [employer], since that ride she felt SOB  Patient progressively had worsening shortness of breath and was admitted for acute pneumothorax right side Patient subsequently received chest tubes, but despite chest tubes patient had a persistently subsequently went to surgery by Dr. Thelma Barge cardiothoracic surgery was consulted and right thoracotomy and giant bullectomy of the right middle lobe.  She did well post op and was discharged home for close follow-up  Approximately 4 days ago patient had increased work of breathing and shortness of breath Patient was seen in Dr. Thelma Barge office who obtain a chest x-ray which showed a right lower lobe opacification likely consistent with an effusion  Patient subsequently got a CT of her chest which showed a moderate to large sided pleural effusion with right lower lobe lung collapse and atelectasis  Patient seen today for status post spontaneous pneumothorax status post chest tube placement status post right middle lobe bullectomy and now complicated by right-sided pleural effusion  Patient is not in any distress No signs of infection No signs of heart failure at this time  Patient is a Engineer, production and a Equities trader and she is chronically  exposed to become powder making good sprays and she is been doing this for the last 12 years It is a possibility that this is the cause for her bullous disease  I have explained to the patient that she will need an ultrasound-guided thoracentesis for therapeutic and diagnostic purposes  Patient understands plan of action    PAST MEDICAL HISTORY :   has a past medical history of Arthritis, GERD (gastroesophageal reflux disease), Headache(784.0), and Heart murmur.  has a past surgical history that includes Dilation and curettage of uterus; Tubal ligation; ORIF ankle fracture (Left, 03/25/2012); and Thoracotomy (N/A, 10/08/2017). Prior to Admission medications   Medication Sig Start Date End Date Taking? Authorizing Provider  acetaminophen (TYLENOL) 500 MG tablet Take 500 mg by mouth every 6 (six) hours as needed.    [provider]  methocarbamol (ROBAXIN) 500 MG tablet Take 1 tablet (500 mg total) by mouth every 6 (six) hours as needed for muscle spasms (take as needed for muscle spasms). 10/12/17   Donovan Kail, PA-C  oxyCODONE (OXY IR/ROXICODONE) 5 MG immediate release tablet Take 1 tablet (5 mg total) by mouth every 6 (six) hours as needed for severe pain. 10/30/17   Hulda Marin, MD   Allergies  Allergen Reactions  . Hydrocodone-Acetaminophen Nausea Only and Other (See Comments)    Made patient dizzy/light headed after taken.    FAMILY HISTORY:  HTN  SOCIAL HISTORY:  reports that she has never smoked. She has never used smokeless tobacco. She reports that she does not use drugs.  REVIEW OF SYSTEMS:   Constitutional: Negative for fever, chills, weight loss, malaise/fatigue and diaphoresis.  HENT: Negative  for hearing loss, ear pain, nosebleeds, congestion, sore throat, neck pain, tinnitus and ear discharge.   Eyes: Negative for blurred vision, double vision, photophobia, pain, discharge and redness.  Respiratory: Negative for cough, hemoptysis, sputum production,  shortness of breath, wheezing and stridor.   Cardiovascular: Negative for chest pain, palpitations, orthopnea, claudication, leg swelling and PND.  Gastrointestinal: Negative for heartburn, nausea, vomiting, abdominal pain, diarrhea, constipation, blood in stool and melena.  Genitourinary: Negative for dysuria, urgency, frequency, hematuria and flank pain.  Musculoskeletal: Negative for myalgias, back pain, joint pain and falls.  Skin: Negative for itching and rash.  Neurological: Negative for dizziness, tingling, tremors, sensory change, speech change, focal weakness, seizures, loss of consciousness, weakness and headaches.  Endo/Heme/Allergies: Negative for environmental allergies and polydipsia. Does not bruise/bleed easily.  ALL OTHER ROS ARE NEGATIVE  BP 132/82 (BP Location: Left Arm, Cuff Size: Normal)   Pulse 95   Resp 16   Ht 5\' 6"  (1.676 m)   Wt 189 lb (85.7 kg)   LMP 11/05/2017   SpO2 99%   BMI 30.51 kg/m   Physical Examination:   GENERAL:NAD, no fevers, chills, no weakness no fatigue HEAD: Normocephalic, atraumatic.  EYES: Pupils equal, round, reactive to light. Extraocular muscles intact. No scleral icterus.  MOUTH: Moist mucosal membrane.   EAR, NOSE, THROAT: Clear without exudates. No external lesions.  NECK: Supple. No thyromegaly. No nodules. No JVD.  PULMONARY:CTA B/L no wheezes, no crackles, no rhonchi CARDIOVASCULAR: S1 and S2. Regular rate and rhythm. No murmurs, rubs, or gallops. No edema.  GASTROINTESTINAL: Soft, nontender, nondistended. No masses. Positive bowel sounds.  MUSCULOSKELETAL: No swelling, clubbing, or edema. Range of motion full in all extremities.  NEUROLOGIC: Cranial nerves II through XII are intact. No gross focal neurological deficits.  SKIN: No ulceration, lesions, rashes, or cyanosis. Skin warm and dry. Turgor intact.  PSYCHIATRIC: Mood, affect within normal limits. The patient is awake, alert and oriented x 3. Insight, judgment intact.        ASSESSMENT / PLAN: 37 year old pleasant African-American female with acute spontaneous pneumothorax status post right middle lobe bullectomy complicated by a right-sided pleural effusion based on CT scan findings  At this time I do believe patient has underlying bullous disease probably from chronic closure to baking goods baking powder sprays over the last 12 years.  However patient has been asymptomatic no respiratory issues until she developed a spontaneous pneumothorax which was caused by increased thoracic pressure and Valsalva maneuver by riding a roller coaster  At this time the etiology of the effusion is unknown but she may have underlying inflammatory process that is causing her acute right-sided pleural effusion  Patient will need therapeutic and diagnostic thoracentesis at this time.  At this time I have explained the procedure to the patient and she understands and will undergo the procedure while she is under stable condition  I have explained to her that the pleural effusion may come back and will need another thoracentesis. I have also explained to her that we will plan for therapy based on her symptoms    Patient/Family are satisfied with Plan of action and management. All questions answered Follow up in 6 months  Licia Harl Santiago Glad, M.D.  [facility]  Medical Director [facility] Medical Director [facility] Cardio-Pulmonary Department

## 2017-11-14 ENCOUNTER — Telehealth: Payer: Self-pay | Admitting: Cardiothoracic Surgery

## 2017-11-14 ENCOUNTER — Other Ambulatory Visit: Payer: Self-pay

## 2017-11-14 LAB — TRIGLYCERIDES, BODY FLUIDS: TRIGLYCERIDES FL: 23 mg/dL

## 2017-11-14 LAB — PH, BODY FLUID: pH, Body Fluid: 7.7

## 2017-11-14 NOTE — Telephone Encounter (Signed)
Patient is calling said she was seen on Monday by Dr. Thelma Barge and was sent to see another doctor, patient is asking does she need to follow up back with Dr. Thelma Barge after she saw the other doctor. Please call patient and advise.

## 2017-11-14 NOTE — Telephone Encounter (Signed)
Return on 11/26/2017 to see Dr. Thelma Barge

## 2017-11-15 ENCOUNTER — Telehealth: Payer: Self-pay | Admitting: Internal Medicine

## 2017-11-15 LAB — CYTOLOGY - NON PAP

## 2017-11-15 NOTE — Telephone Encounter (Signed)
Pt states her breathing has gotten some better, but not a great improvement. Please call to discuss.

## 2017-11-15 NOTE — Telephone Encounter (Signed)
Advised not to worry Take motrin 800 mg TID with food for next several days

## 2017-11-17 LAB — BODY FLUID CULTURE: Culture: NO GROWTH

## 2017-11-18 LAB — CHOLESTEROL, BODY FLUID: Cholesterol, Fluid: 106 mg/dL

## 2017-11-26 ENCOUNTER — Other Ambulatory Visit: Payer: Self-pay

## 2017-11-26 ENCOUNTER — Ambulatory Visit
Admission: RE | Admit: 2017-11-26 | Discharge: 2017-11-26 | Disposition: A | Discharge status: Home / Self Care | Payer: BLUE CROSS/BLUE SHIELD | Source: Ambulatory Visit | Attending: Cardiothoracic Surgery | Admitting: Cardiothoracic Surgery

## 2017-11-26 ENCOUNTER — Encounter: Payer: Self-pay | Admitting: Cardiothoracic Surgery

## 2017-11-26 ENCOUNTER — Ambulatory Visit (INDEPENDENT_AMBULATORY_CARE_PROVIDER_SITE_OTHER): Payer: BLUE CROSS/BLUE SHIELD | Admitting: Cardiothoracic Surgery

## 2017-11-26 VITALS — BP 134/94 | HR 76 | Temp 97.0°F | Ht 66.0 in | Wt 193.0 lb

## 2017-11-26 DIAGNOSIS — J9811 Atelectasis: Secondary | ICD-10-CM | POA: Diagnosis not present

## 2017-11-26 DIAGNOSIS — J90 Pleural effusion, not elsewhere classified: Secondary | ICD-10-CM | POA: Insufficient documentation

## 2017-11-26 DIAGNOSIS — J9383 Other pneumothorax: Secondary | ICD-10-CM

## 2017-11-26 NOTE — Progress Notes (Signed)
She returns today in follow-up.  She did have a chest x-ray made today.  She states that she only occasionally feels some twinges of pain and grades her pain as a 1 out of 10.  She has occasional numbness under her right breast.  She has had no cough or shortness of breath.  She was seen by Dr. Gabriel Earing for her right-sided pleural effusion and upon further analysis this revealed extensive eosinophilic rich fluid with 66% of the white cells being eosinophils.  The cytology was otherwise negative.  Her thoracotomy wound is well-healed.  Her lungs are clear on both sides.  Her heart is regular.  I have independently reviewed her chest x-ray.  I do not see any evidence of a pleural effusion or pneumothorax.  She does have an appointment with Dr. Gabriel Earing in 6 months.  I will see her back again in 3 months.

## 2017-11-26 NOTE — Patient Instructions (Addendum)
We  will see you back in 3 months. Please be sure to have a Chest xray prior to seeing Dr.Oaks.

## 2017-11-28 ENCOUNTER — Telehealth: Payer: Self-pay | Admitting: Cardiothoracic Surgery

## 2017-11-28 NOTE — Telephone Encounter (Signed)
I talked with the patient and she will call her PCP Dr Dareen PianoAnderson and ask about a referral and seeing [facility]art[facility]. The patient is aware to call back for any questions or new concerns. Appreciates call.

## 2017-11-28 NOTE — Telephone Encounter (Addendum)
Patient is calling and is asking about going to see a therapist to talk to,  and is asking does Dr. Thelma Bargeaks need to send a referral or does her pcp need to send a referral. Please call patient and advise.

## 2017-12-05 ENCOUNTER — Ambulatory Visit
Admission: RE | Admit: 2017-12-05 | Discharge: 2017-12-05 | Disposition: A | Discharge status: Home / Self Care | Payer: BLUE CROSS/BLUE SHIELD | Source: Ambulatory Visit | Attending: Cardiothoracic Surgery | Admitting: Cardiothoracic Surgery

## 2017-12-05 ENCOUNTER — Encounter: Payer: Self-pay | Admitting: Surgery

## 2017-12-05 ENCOUNTER — Telehealth: Payer: Self-pay

## 2017-12-05 ENCOUNTER — Ambulatory Visit (INDEPENDENT_AMBULATORY_CARE_PROVIDER_SITE_OTHER): Payer: BLUE CROSS/BLUE SHIELD | Admitting: Surgery

## 2017-12-05 VITALS — BP 142/102 | HR 88 | Temp 97.7°F | Resp 16 | Ht 66.0 in | Wt 194.2 lb

## 2017-12-05 DIAGNOSIS — J90 Pleural effusion, not elsewhere classified: Secondary | ICD-10-CM | POA: Insufficient documentation

## 2017-12-05 DIAGNOSIS — Z9889 Other specified postprocedural states: Secondary | ICD-10-CM | POA: Diagnosis not present

## 2017-12-05 DIAGNOSIS — J9811 Atelectasis: Secondary | ICD-10-CM | POA: Diagnosis not present

## 2017-12-05 DIAGNOSIS — Z09 Encounter for follow-up examination after completed treatment for conditions other than malignant neoplasm: Secondary | ICD-10-CM

## 2017-12-05 DIAGNOSIS — J9383 Other pneumothorax: Secondary | ICD-10-CM

## 2017-12-05 DIAGNOSIS — J438 Other emphysema: Secondary | ICD-10-CM | POA: Insufficient documentation

## 2017-12-05 DIAGNOSIS — R079 Chest pain, unspecified: Secondary | ICD-10-CM | POA: Diagnosis present

## 2017-12-05 NOTE — Telephone Encounter (Signed)
Spoke with patient. She states she is having back pain, piercing and its like it was when her lung collapsed.  Denies fever or chills. She states she is having chest pain as well.  Spoke with Dr.Oaks and he is assisting with surgery and asked if Dr. Everlene FarrierPabon could see the patient.  Patient is added to Dr.Pabons schedule today. Patient will need chest xray prior to office visit.    Spoke with patient and let her know to come at 2:00 and to have chest xray prior.

## 2017-12-05 NOTE — Patient Instructions (Addendum)
Please call our office if you have questions or concerns.   You may also apply ice pack to the area.

## 2017-12-07 ENCOUNTER — Encounter: Payer: Self-pay | Admitting: Surgery

## 2017-12-07 NOTE — Progress Notes (Signed)
S/p Right thoracotomy bleblectomy by Dr. Thelma Bargeaks Has some right chest pain CXR pers reviewed, no ptx   PE NAD Chest CTA bilaterally. Scar w/o infection or hematoma  A/P Doing well Pain likely attributed to thoracotomy D/w the pt in detail Offered her gabapentin but at this time she is ok She will follow w Dr. Thelma Bargeaks as previously scheduled

## 2018-02-04 ENCOUNTER — Other Ambulatory Visit
Admission: RE | Admit: 2018-02-04 | Discharge: 2018-02-04 | Disposition: A | Discharge status: Home / Self Care | Payer: BLUE CROSS/BLUE SHIELD | Source: Ambulatory Visit | Attending: Internal Medicine | Admitting: Internal Medicine

## 2018-02-04 DIAGNOSIS — R079 Chest pain, unspecified: Secondary | ICD-10-CM | POA: Insufficient documentation

## 2018-02-11 ENCOUNTER — Telehealth: Payer: Self-pay | Admitting: *Deleted

## 2018-02-11 ENCOUNTER — Other Ambulatory Visit
Admission: RE | Admit: 2018-02-11 | Discharge: 2018-02-11 | Disposition: A | Discharge status: Home / Self Care | Payer: BLUE CROSS/BLUE SHIELD | Source: Ambulatory Visit | Attending: Internal Medicine | Admitting: Internal Medicine

## 2018-02-11 DIAGNOSIS — R079 Chest pain, unspecified: Secondary | ICD-10-CM | POA: Diagnosis not present

## 2018-02-11 LAB — TROPONIN I: Troponin I: <0.03 ng/mL (ref <0.03)

## 2018-02-11 LAB — FIBRIN DERIVATIVES D-DIMER (ARMC ONLY): FIBRIN DERIVATIVES D-DIMER (ARMC): 180.37 ng{FEU}/mL (ref 0.00–499.00)

## 2018-02-11 NOTE — Telephone Encounter (Signed)
Patient states that she has had some pain in her chest just under her right breast that will go to her back. Last week had it once but lasted most of the day. Yesterday it would come and go and last only a few seconds but was a piercing type pain. She also has been feeling a little winded when going up and down her stairs. She reports that she has started increasing her activity and has been doing some exercises and wonders if this is related to that or not.   Spoke with Dr Thelma Barge and he recommended that she follow up with her PCP for assessment and may follow up with him as scheduled with a CXR prior.   Patient is aware.

## 2018-02-11 NOTE — Telephone Encounter (Signed)
Patient called and stated for the past 2 weeks she is having pain at her surgery site, yesterday was like a piercing pain on right side which radiated to her back, she also stated that she is getting "winded" as walking up and down the stairs. Please call and advise

## 2018-02-25 ENCOUNTER — Other Ambulatory Visit: Payer: Self-pay

## 2018-02-25 DIAGNOSIS — J939 Pneumothorax, unspecified: Secondary | ICD-10-CM

## 2018-02-27 ENCOUNTER — Ambulatory Visit: Payer: BLUE CROSS/BLUE SHIELD | Admitting: Cardiothoracic Surgery

## 2018-03-01 ENCOUNTER — Ambulatory Visit: Payer: BLUE CROSS/BLUE SHIELD | Admitting: Cardiothoracic Surgery

## 2018-03-01 ENCOUNTER — Other Ambulatory Visit: Payer: Self-pay

## 2018-03-01 ENCOUNTER — Ambulatory Visit
Admission: RE | Admit: 2018-03-01 | Discharge: 2018-03-01 | Disposition: A | Discharge status: Home / Self Care | Payer: BLUE CROSS/BLUE SHIELD | Source: Ambulatory Visit | Attending: Cardiothoracic Surgery | Admitting: Cardiothoracic Surgery

## 2018-03-01 ENCOUNTER — Ambulatory Visit (INDEPENDENT_AMBULATORY_CARE_PROVIDER_SITE_OTHER): Payer: BLUE CROSS/BLUE SHIELD | Admitting: Cardiothoracic Surgery

## 2018-03-01 ENCOUNTER — Encounter: Payer: Self-pay | Admitting: Cardiothoracic Surgery

## 2018-03-01 ENCOUNTER — Ambulatory Visit
Admission: RE | Admit: 2018-03-01 | Discharge: 2018-03-01 | Disposition: A | Discharge status: Home / Self Care | Payer: BLUE CROSS/BLUE SHIELD | Attending: Cardiothoracic Surgery | Admitting: Cardiothoracic Surgery

## 2018-03-01 VITALS — BP 143/87 | HR 80 | Temp 97.2°F | Resp 16 | Ht 66.0 in | Wt 193.0 lb

## 2018-03-01 DIAGNOSIS — J939 Pneumothorax, unspecified: Secondary | ICD-10-CM

## 2018-03-01 DIAGNOSIS — J9383 Other pneumothorax: Secondary | ICD-10-CM

## 2018-03-01 NOTE — Progress Notes (Signed)
  Patient ID: Theresa Powell, female   DOB: [birth year], 38 y.o.   MRN: [id number]  HISTORY: She contact our office and requested a follow-up visit secondary to some chest discomfort.  She had been pain-free for several weeks but then began experiencing some right parasternal pain that radiated through to her back.  It really was not associated with any physical activity as she could be exercising and not feel the pain.  She did go to her primary care physician who ordered a chest x-ray and stated that the x-ray looked fine.  She had another chest x-ray made today.  She states that she has only mild discomfort this morning and is not short of breath.   Vitals:   03/01/18 0955  BP: (!) 143/87  Pulse: 80  Resp: 16  Temp: (!) 97.2 F (36.2 C)  SpO2: 100%     EXAM:    Resp: Lungs are clear bilaterally.  No respiratory distress, normal effort. Heart:  Regular without murmurs Abd:  Abdomen is soft, non distended and non tender. No masses are palpable.  There is no rebound and no guarding.  Neurological: Alert and oriented to person, place, and time. Coordination normal.  Skin: Skin is warm and dry. No rash noted. No diaphoretic. No erythema. No pallor. Her thoracotomy incision is well-healed. Psychiatric: Normal mood and affect. Normal behavior. Judgment and thought content normal.    ASSESSMENT: I have independently reviewed her chest x-ray.  I see no pleural effusion or pneumothorax.   PLAN:   I explained to her that I did not have any obvious explanation for why she her pain would return after being pain-free for so long.  Perhaps it is related to her exercise and she might have pulled something.  I really did not have a good explanation.  However I did encourage her to seek attention if she develops similar pains or is short of breath.  She will come back to see [place] as needed.    Hulda Marin, MD

## 2018-03-01 NOTE — Patient Instructions (Signed)
Please call our office if you have any questions or concerns.  

## 2018-05-05 ENCOUNTER — Other Ambulatory Visit: Payer: Self-pay

## 2018-05-05 ENCOUNTER — Emergency Department
Admission: EM | Admit: 2018-05-05 | Discharge: 2018-05-05 | Disposition: A | Discharge status: Home / Self Care | Payer: BLUE CROSS/BLUE SHIELD | Attending: Emergency Medicine | Admitting: Emergency Medicine

## 2018-05-05 ENCOUNTER — Emergency Department: Payer: BLUE CROSS/BLUE SHIELD

## 2018-05-05 ENCOUNTER — Encounter: Payer: Self-pay | Admitting: Emergency Medicine

## 2018-05-05 DIAGNOSIS — R112 Nausea with vomiting, unspecified: Secondary | ICD-10-CM | POA: Diagnosis not present

## 2018-05-05 DIAGNOSIS — K5900 Constipation, unspecified: Secondary | ICD-10-CM | POA: Diagnosis present

## 2018-05-05 DIAGNOSIS — R1013 Epigastric pain: Secondary | ICD-10-CM | POA: Diagnosis not present

## 2018-05-05 LAB — URINALYSIS, COMPLETE (UACMP) WITH MICROSCOPIC
Bilirubin Urine: NEGATIVE
Glucose, UA: NEGATIVE mg/dL
Ketones, ur: 80 mg/dL — AB
Leukocytes,Ua: NEGATIVE
Nitrite: NEGATIVE
Protein, ur: 100 mg/dL — AB
Specific Gravity, Urine: 1.031 — ABNORMAL HIGH (ref 1.005–1.030)
pH: 6 (ref 5.0–8.0)

## 2018-05-05 LAB — COMPREHENSIVE METABOLIC PANEL
ALT: 20 U/L (ref 0–44)
AST: 19 U/L (ref 15–41)
Albumin: 4.8 g/dL (ref 3.5–5.0)
Alkaline Phosphatase: 92 U/L (ref 38–126)
Anion gap: 12 (ref 5–15)
BUN: 12 mg/dL (ref 6–20)
CO2: 26 mmol/L (ref 22–32)
Calcium: 9.3 mg/dL (ref 8.9–10.3)
Chloride: 101 mmol/L (ref 98–111)
Creatinine, Ser: 1 mg/dL (ref 0.44–1.00)
GFR calc Af Amer: >60 mL/min (ref >60)
GFR calc non Af Amer: >60 mL/min (ref >60)
Glucose, Bld: 102 mg/dL — ABNORMAL HIGH (ref 70–99)
Potassium: 3.5 mmol/L (ref 3.5–5.1)
Sodium: 139 mmol/L (ref 135–145)
Total Bilirubin: 0.5 mg/dL (ref 0.3–1.2)
Total Protein: 8.6 g/dL — ABNORMAL HIGH (ref 6.5–8.1)

## 2018-05-05 LAB — CBC
HCT: 44.5 % (ref 36.0–46.0)
Hemoglobin: 14.4 g/dL (ref 12.0–15.0)
MCH: 25.9 pg — ABNORMAL LOW (ref 26.0–34.0)
MCHC: 32.4 g/dL (ref 30.0–36.0)
MCV: 80 fL (ref 80.0–100.0)
Platelets: 277 10*3/uL (ref 150–400)
RBC: 5.56 MIL/uL — ABNORMAL HIGH (ref 3.87–5.11)
RDW: 13 % (ref 11.5–15.5)
WBC: 7 10*3/uL (ref 4.0–10.5)
nRBC: 0 % (ref 0.0–0.2)

## 2018-05-05 LAB — PREGNANCY, URINE: Preg Test, Ur: NEGATIVE

## 2018-05-05 LAB — LIPASE, BLOOD: Lipase: 33 U/L (ref 11–51)

## 2018-05-05 MED ORDER — DICYCLOMINE HCL 20 MG PO TABS: 20.0000 mg | ORAL_TABLET | Freq: Three times a day (TID) | ORAL | 0 refills | Status: AC | PRN

## 2018-05-05 MED ORDER — PROMETHAZINE HCL 12.5 MG PO TABS: 12.5000 mg | ORAL_TABLET | Freq: Four times a day (QID) | ORAL | 0 refills | Status: DC | PRN

## 2018-05-05 MED ORDER — PROMETHAZINE HCL 25 MG/ML IJ SOLN
12.5000 mg | Freq: Once | INTRAMUSCULAR | Status: AC
  Administered 2018-05-05: 18:00:00 12.5 mg via INTRAVENOUS
  Filled 2018-05-05: qty 1

## 2018-05-05 MED ORDER — SODIUM CHLORIDE 0.9 % IV BOLUS
1000.0000 mL | Freq: Once | INTRAVENOUS | Status: AC
  Administered 2018-05-05: 1000 mL via INTRAVENOUS

## 2018-05-05 MED ORDER — IOHEXOL 300 MG/ML  SOLN
100.0000 mL | Freq: Once | INTRAMUSCULAR | Status: AC | PRN
  Administered 2018-05-05: 100 mL via INTRAVENOUS

## 2018-05-05 MED ORDER — ONDANSETRON 4 MG PO TBDP
4.0000 mg | ORAL_TABLET | Freq: Once | ORAL | Status: AC
  Administered 2018-05-05: 4 mg via ORAL
  Filled 2018-05-05: qty 1

## 2018-05-05 MED ORDER — LANSOPRAZOLE 30 MG PO TBDD: 30.0000 mg | DELAYED_RELEASE_TABLET | Freq: Every day | ORAL | 1 refills | Status: AC

## 2018-05-05 NOTE — ED Notes (Signed)
Patient transported to Ultrasound 

## 2018-05-05 NOTE — ED Notes (Signed)
Patient transported to CT 

## 2018-05-05 NOTE — ED Triage Notes (Addendum)
Here for vomiting. Cannot keep liquids down. Started yesterday AM.  Vomiting came first but now also having lower abdominal pain.  No urinary sx. No diarrhea or fevers.  Last BM early this AM but not normal per pt; has been constipated and took laxative last night.  Voice hoarse from acid of vomit

## 2018-05-05 NOTE — ED Provider Notes (Signed)
[facility] Emergency Department Provider Note ____________________________________________   First MD Initiated Contact with Patient 05/05/18 1553     (approximate)  I have reviewed the triage vital signs and the nursing notes.   HISTORY  Chief Complaint Emesis  HPI Theresa Powell is a 38 y.o. female who presents to the emergency department for treatment and evaluation of constipation, nausea, and vomiting.  She states that she has been constipated for the past 2 to 3 days and decided to take a Dulcolax tablet last night.  She states that she had very little results from the Dulcolax and has continued to have vomiting throughout the day.  She states that at this point her epigastric area is "on fire."  She denies noticing any blood in the emesis.  No previous similar symptoms.   Other than a pneumothorax last year and chronic GERD, she has no significant past medical history or chronic medical conditions.     Past Medical History:  Diagnosis Date  . Arthritis   . GERD (gastroesophageal reflux disease)   . Headache(784.0)    "migraines"  . Heart murmur    "as a child"    Patient Active Problem List   Diagnosis Date Noted  . Tension pneumothorax, spontaneous   . Pneumothorax 10/02/2017  . Pneumothorax on right 10/01/2017  . Depression, major, recurrent, moderate (HCC) 02/23/2014  . Migraine without aura and without status migrainosus, not intractable 02/23/2014    Past Surgical History:  Procedure Laterality Date  . DILATION AND CURETTAGE OF UTERUS    . ORIF ANKLE FRACTURE Left 03/25/2012   Procedure: OPEN REDUCTION INTERNAL FIXATION (ORIF) ANKLE FRACTURE;  Surgeon: Nadara Mustard, MD;  Location: [facility] OR;  Service: Orthopedics;  Laterality: Left;  Open Reduction Internal Fixation Left Ankle  . THORACOTOMY N/A 10/08/2017   Procedure: THORACOTOMY/ LOBECTOMY;  Surgeon: Henrene Dodge, MD;  Location: [facility] ORS;  Service: General;  Laterality: N/A;  . TUBAL  LIGATION      Prior to Admission medications   Medication Sig Start Date End Date Taking? Authorizing Provider  sertraline (ZOLOFT) 100 MG tablet Take 100 mg by mouth daily. 03/08/18  Yes [provider]  traZODone (DESYREL) 100 MG tablet Take 100-200 mg by mouth at bedtime as needed for sleep. 04/05/18  Yes [provider]  acetaminophen (TYLENOL) 500 MG tablet Take 500 mg by mouth every 6 (six) hours as needed.    [provider]  dicyclomine (BENTYL) 20 MG tablet Take 1 tablet (20 mg total) by mouth 3 (three) times daily as needed for spasms. 05/05/18 05/05/19  Isaish Alemu, Rulon Eisenmenger B, FNP  lansoprazole (PREVACID SOLUTAB) 30 MG disintegrating tablet Take 1 tablet (30 mg total) by mouth daily at 12 noon. 05/05/18   Alexsys Eskin, Kasandra Knudsen, FNP  promethazine (PHENERGAN) 12.5 MG tablet Take 1 tablet (12.5 mg total) by mouth every 6 (six) hours as needed for nausea or vomiting. 05/05/18   Marc Leichter, Rulon Eisenmenger B, FNP  venlafaxine XR (EFFEXOR-XR) 75 MG 24 hr capsule Take 75 mg by mouth daily with breakfast. 05/01/18   [provider]    Allergies Hydrocodone-acetaminophen  History reviewed. No pertinent family history.  Social History Social History   Tobacco Use  . Smoking status: Never Smoker  . Smokeless tobacco: Never Used  Substance Use Topics  . Alcohol use: Not on file    Comment: "social"  . Drug use: No    Review of Systems  Constitutional: No fever/chills. Eyes: No visual changes. ENT:  No sore throat. Cardiovascular: Denies chest pain. Respiratory: Denies shortness of breath. Gastrointestinal: Positive for abdominal pain.  Positive for nausea, positive for vomiting.  No diarrhea.  No constipation. Genitourinary: Negative for dysuria. Musculoskeletal: Negative for back pain. Skin: Negative for rash. Neurological: Positive for headache, negative for focal weakness or numbness. ____________________________________________   PHYSICAL EXAM:  VITAL SIGNS: ED  Triage Vitals [05/05/18 1450]  Enc Vitals Group     BP 137/90     Pulse Rate 92     Resp 16     Temp 97.8 F (36.6 C)     Temp Source Oral     SpO2 99 %     Weight 200 lb (90.7 kg)     Height  (1.676 m)     Head Circumference      Peak Flow      Pain Score 5     Pain Loc      Pain Edu?      Excl. in GC?     Constitutional: Alert and oriented. Well appearing and in no acute distress. Eyes: Conjunctivae are normal. PERRL. EOMI. Head: Atraumatic. Nose: No congestion/rhinnorhea. Mouth/Throat: Mucous membranes are moist.  Oropharynx non-erythematous. Neck: No stridor.   Cardiovascular: Normal rate, regular rhythm. Grossly normal heart sounds.  Good peripheral circulation. Respiratory: Normal respiratory effort.  No retractions. Lungs CTAB. Gastrointestinal: Soft and nontender. No distention. No abdominal bruits. No CVA tenderness. Musculoskeletal: No lower extremity tenderness nor edema.  No joint effusions. Neurologic:  Normal speech and language. No gross focal neurologic deficits are appreciated. No gait instability. Skin:  Skin is warm, dry and intact. No rash noted. Psychiatric: Mood and affect are normal. Speech and behavior are normal.  ____________________________________________   LABS (all labs ordered are listed, but only abnormal results are displayed)  Labs Reviewed  COMPREHENSIVE METABOLIC PANEL - Abnormal; Notable for the following components:      Result Value   Glucose, Bld 102 (*)    Total Protein 8.6 (*)    All other components within normal limits  CBC - Abnormal; Notable for the following components:   RBC 5.56 (*)    MCH 25.9 (*)    All other components within normal limits  URINALYSIS, COMPLETE (UACMP) WITH MICROSCOPIC - Abnormal; Notable for the following components:   Color, Urine YELLOW (*)    APPearance HAZY (*)    Specific Gravity, Urine 1.031 (*)    Hgb urine dipstick SMALL (*)    Ketones, ur 80 (*)    Protein, ur 100 (*)     Bacteria, UA RARE (*)    All other components within normal limits  LIPASE, BLOOD  PREGNANCY, URINE   ____________________________________________  EKG  Not indicated. ____________________________________________  RADIOLOGY  ED MD interpretation:  No acute findings on US of the abdomen or CT abdomen and pelvis with contrast.  Official radiology report(s): Ct Abdomen Pelvis W Contrast  Result Date: 05/05/2018 CLINICAL DATA:  Vomiting.  Lower abdominal pain. EXAM: CT ABDOMEN AND PELVIS WITH CONTRAST TECHNIQUE: Multidetector CT imaging of the abdomen and pelvis was performed using the standard protocol following bolus administration of intravenous contrast. CONTRAST:  OMNIPAQUE IOHEXOL 300 MG/ML  SOLN COMPARISON:  Chest CT 11/12/2017. FINDINGS: Lower chest: Architectural distortion with parenchymal staple line noted right middle lobe, incompletely visualized similar to prior chest CT. Hepatobiliary: No suspicious focal abnormality within the liver parenchyma. There is no evidence for gallstones, gallbladder wall thickening, or pericholecystic fluid. No intrahepatic or extrahepatic  biliary dilation. Pancreas: No focal mass lesion. No dilatation of the main duct. No intraparenchymal cyst. No peripancreatic edema. Spleen: No splenomegaly. No focal mass lesion. Adrenals/Urinary Tract: No adrenal nodule or mass. Kidneys unremarkable. No evidence for hydroureter. The urinary bladder appears normal for the degree of distention. Stomach/Bowel: Stomach is unremarkable. No gastric wall thickening. No evidence of outlet obstruction. Duodenum is normally positioned as is the ligament of Treitz. No small bowel wall thickening. No small bowel dilatation. The terminal ileum is normal. The appendix is normal. No gross colonic mass. No colonic wall thickening. Vascular/Lymphatic: No abdominal aortic aneurysm. No abdominal aortic atherosclerotic calcification. There is no gastrohepatic or hepatoduodenal ligament  lymphadenopathy. No intraperitoneal or retroperitoneal lymphadenopathy. No pelvic sidewall lymphadenopathy. Reproductive: The uterus is unremarkable. There is no adnexal mass. Prominent left gonadal venous anatomy. Other: No intraperitoneal free fluid. Musculoskeletal: No worrisome lytic or sclerotic osseous abnormality. IMPRESSION: 1. No acute findings in the abdomen or pelvis. Specifically, no findings to explain the patient's history of vomiting and pain. 2. Prominent left gonadal venous anatomy, nonspecific. In the appropriate clinical setting, pelvic congestion syndrome could be considered. 3. Surgical changes anterior right lung base. Electronically Signed   By: Kennith CenterEric  Mansell M.D.   On: 05/05/2018 18:53   [place] Abdomen Limited  Result Date: 05/05/2018 CLINICAL DATA:  Nausea. EXAM: ULTRASOUND ABDOMEN LIMITED RIGHT UPPER QUADRANT COMPARISON:  None. FINDINGS: Gallbladder: No gallstones or gallbladder wall thickening. No pericholecystic fluid. The sonographer reports no sonographic Murphy's sign. Common bile duct: Diameter: 3 mm Liver: No focal lesion identified. Within normal limits in parenchymal echogenicity. Portal vein is patent on color Doppler imaging with normal direction of blood flow towards the liver. IMPRESSION: Unremarkable right upper quadrant ultrasound. No findings to explain the patient's history of nausea. Electronically Signed   By: Kennith CenterEric  Mansell M.D.   On: 05/05/2018 17:40    ____________________________________________   PROCEDURES  Procedure(s) performed: None  Procedures  Critical Care performed: No  ____________________________________________   INITIAL IMPRESSION / ASSESSMENT AND PLAN / ED COURSE    38 year old female presenting to the emergency department for treatment and evaluation of abdominal pain with vomiting.  Labs, ultrasound, and CT of the abdomen and pelvis with contrast is reassuring.  While in the department, she received IV fluids and Phenergan with  significant improvement.  She had no further vomiting episodes while here.  She will be discharged home with prescriptions for Bentyl, Phenergan, and advised to take her prescribed Prevacid.  She was given follow-up information for GI as well.  She is to see primary care or the specialist for symptoms that do not seem to be improving with medications.  She was encouraged to return to the emergency department for symptoms of change or worsen if she is unable to schedule an appointment.      ____________________________________________   FINAL CLINICAL IMPRESSION(S) / ED DIAGNOSES  Final diagnoses:  Epigastric pain  Intractable vomiting with nausea, unspecified vomiting type     ED Discharge Orders         Ordered    dicyclomine (BENTYL) 20 MG tablet  3 times daily PRN     05/05/18 1956    promethazine (PHENERGAN) 12.5 MG tablet  Every 6 hours PRN     05/05/18 1956    lansoprazole (PREVACID SOLUTAB) 30 MG disintegrating tablet  Daily     05/05/18 1956           Note:  This document was prepared using Dragon voice recognition  software and may include unintentional dictation errors.    Chinita Pester, FNP 05/05/18 2034    Emily Filbert, MD 05/05/18 2051

## 2018-05-07 ENCOUNTER — Telehealth: Payer: Self-pay | Admitting: Gastroenterology

## 2018-05-07 NOTE — Telephone Encounter (Signed)
I called patient & left message letting her know to call the office to schedule an appointment if she is not improving from her visit in the ED. Dr Allegra Lai request she return here in 1wk if no improvement.

## 2019-03-10 IMAGING — DX DG CHEST 1V PORT
1 series · 1 of 1 positions shown · non-contrast
Comparison: Portable chest x-ray October 10, 2017

CLINICAL DATA: Clinically improved today. Occasional sharp
right-sided chest discomfort. Follow-up pneumothorax and chest tube
treatment.

EXAM:
PORTABLE CHEST 1 VIEW

[chest ap]
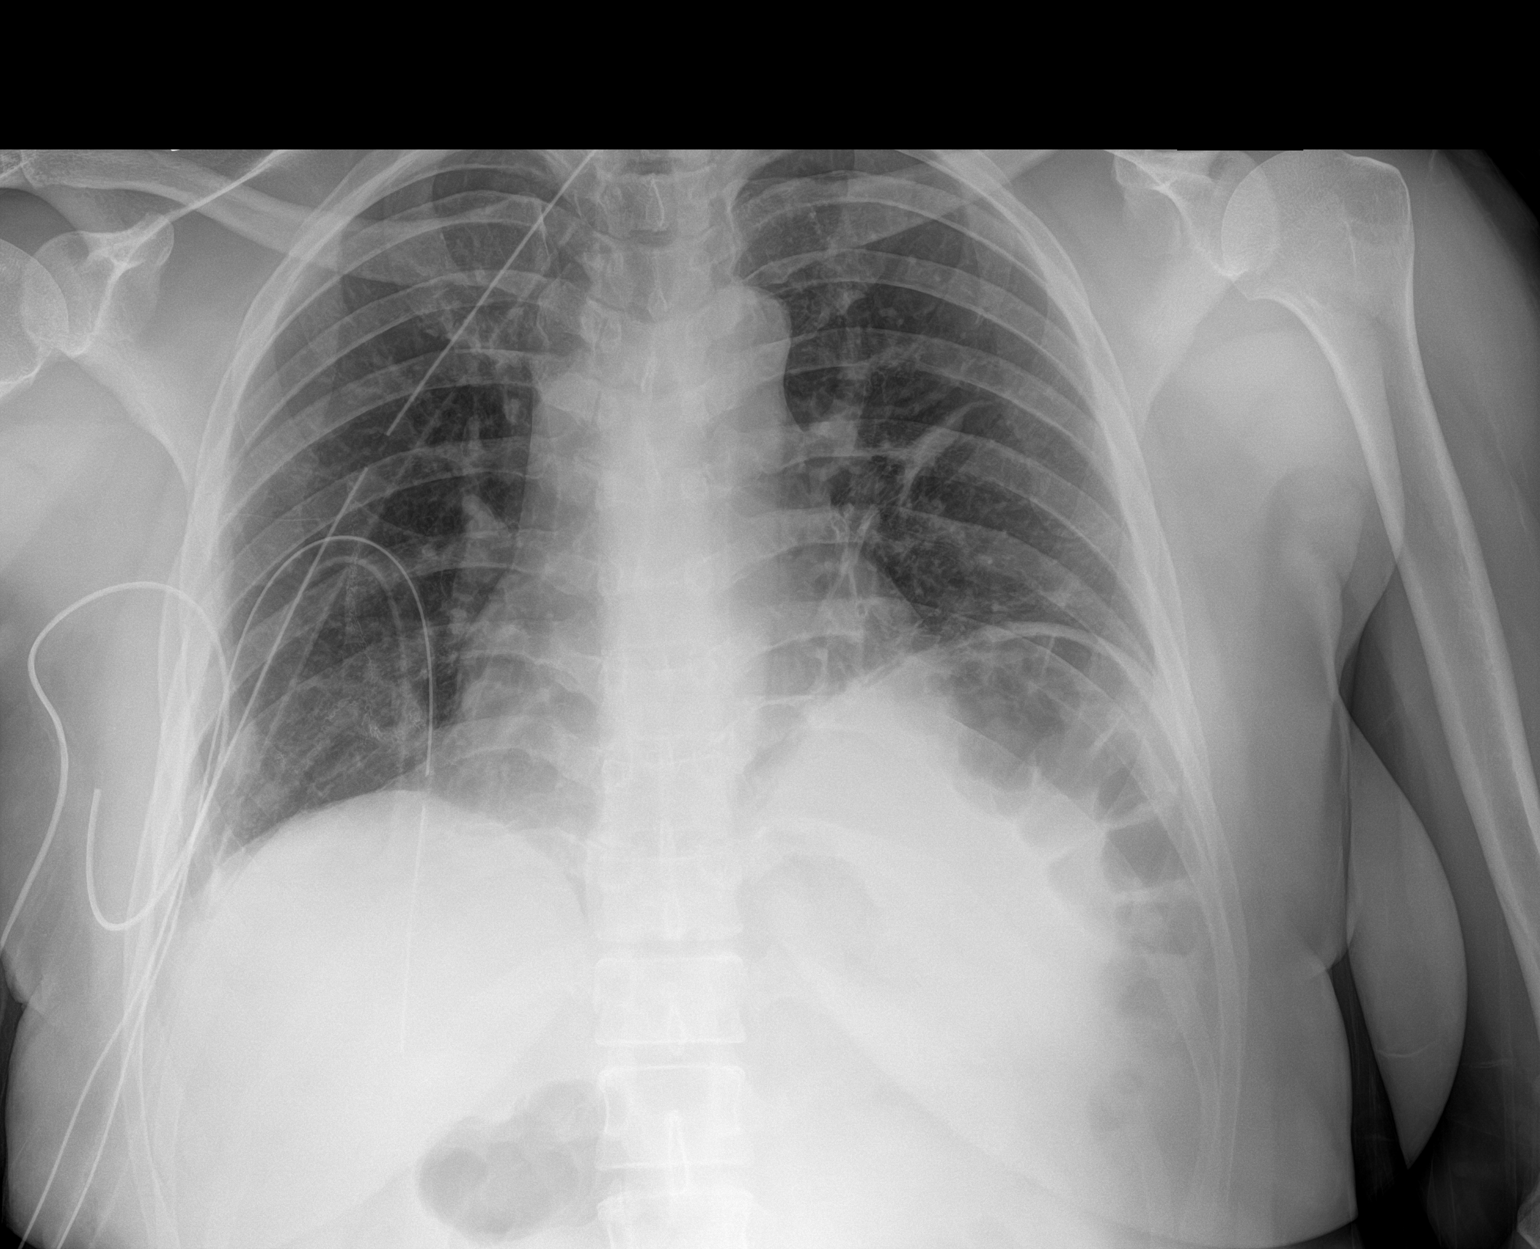

[1 of 1 positions shown; findings below may reference images not displayed]

FINDINGS: There remains elevation of the left hemidiaphragm with mild
left-sided hypoinflation. There is stable subsegmental atelectasis
in the left mid and lower lung. There is no left-sided pneumothorax
or significant pleural effusion. The right lung is better inflated.
There is no pneumothorax. The 2 right chest tubes are in stable
position. There is no mediastinal shift. The heart and pulmonary
vascularity are normal.
IMPRESSION: No pneumothorax. No significant pleural effusion. Subsegmental
atelectasis of the left lung with mild elevation of the
hemidiaphragm.

## 2019-05-04 IMAGING — CR DG CHEST 2V
1 series · 2 of 2 positions shown · non-contrast
Comparison: 11/26/2017 and CT chest 11/12/2017.

CLINICAL DATA: Sharp left-sided chest pain since yesterday.

EXAM:
CHEST - 2 VIEW

[Series 1: dg chest 2 view · 0.14mm/px · 2 of 2 slices shown]
[im 1/2]
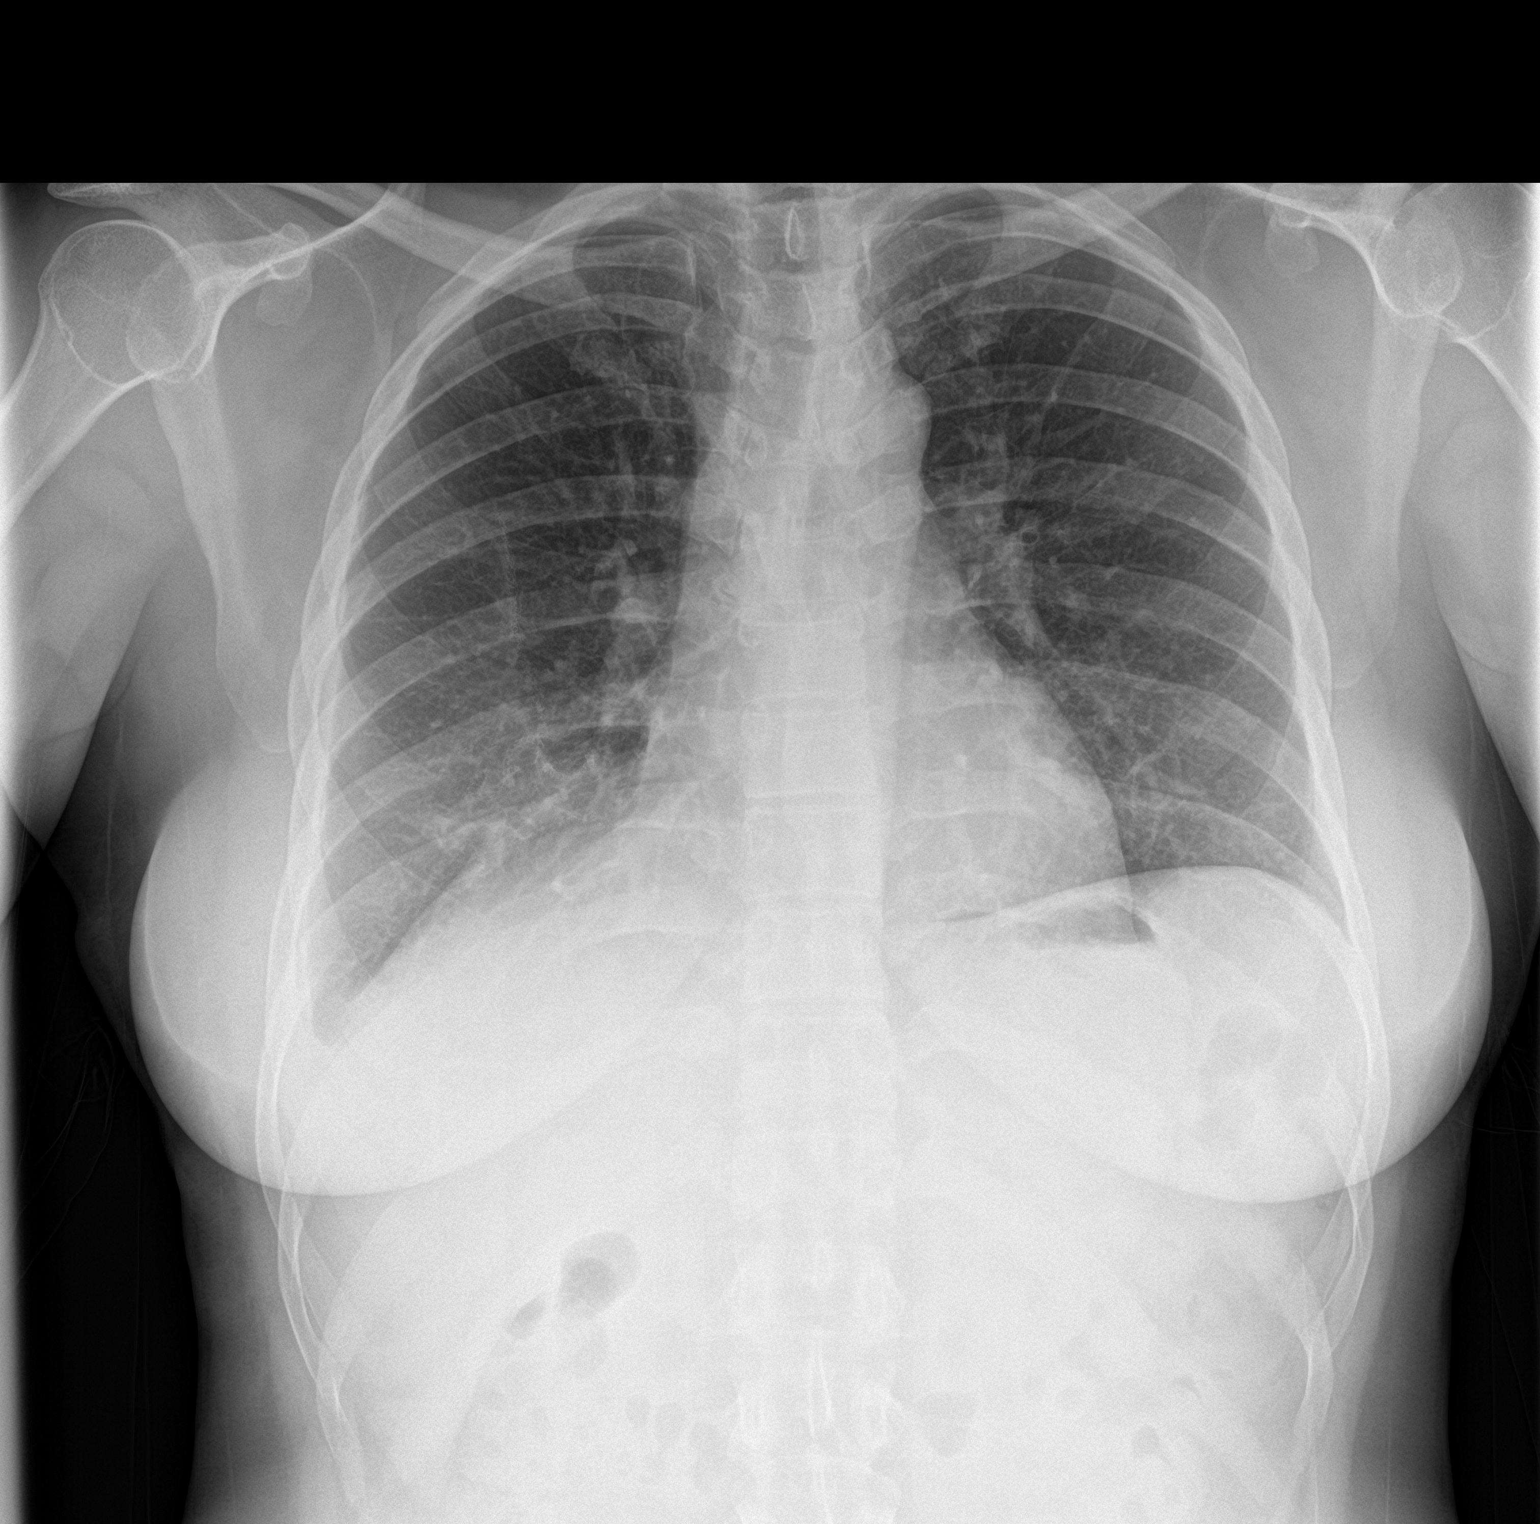
[im 2/2]
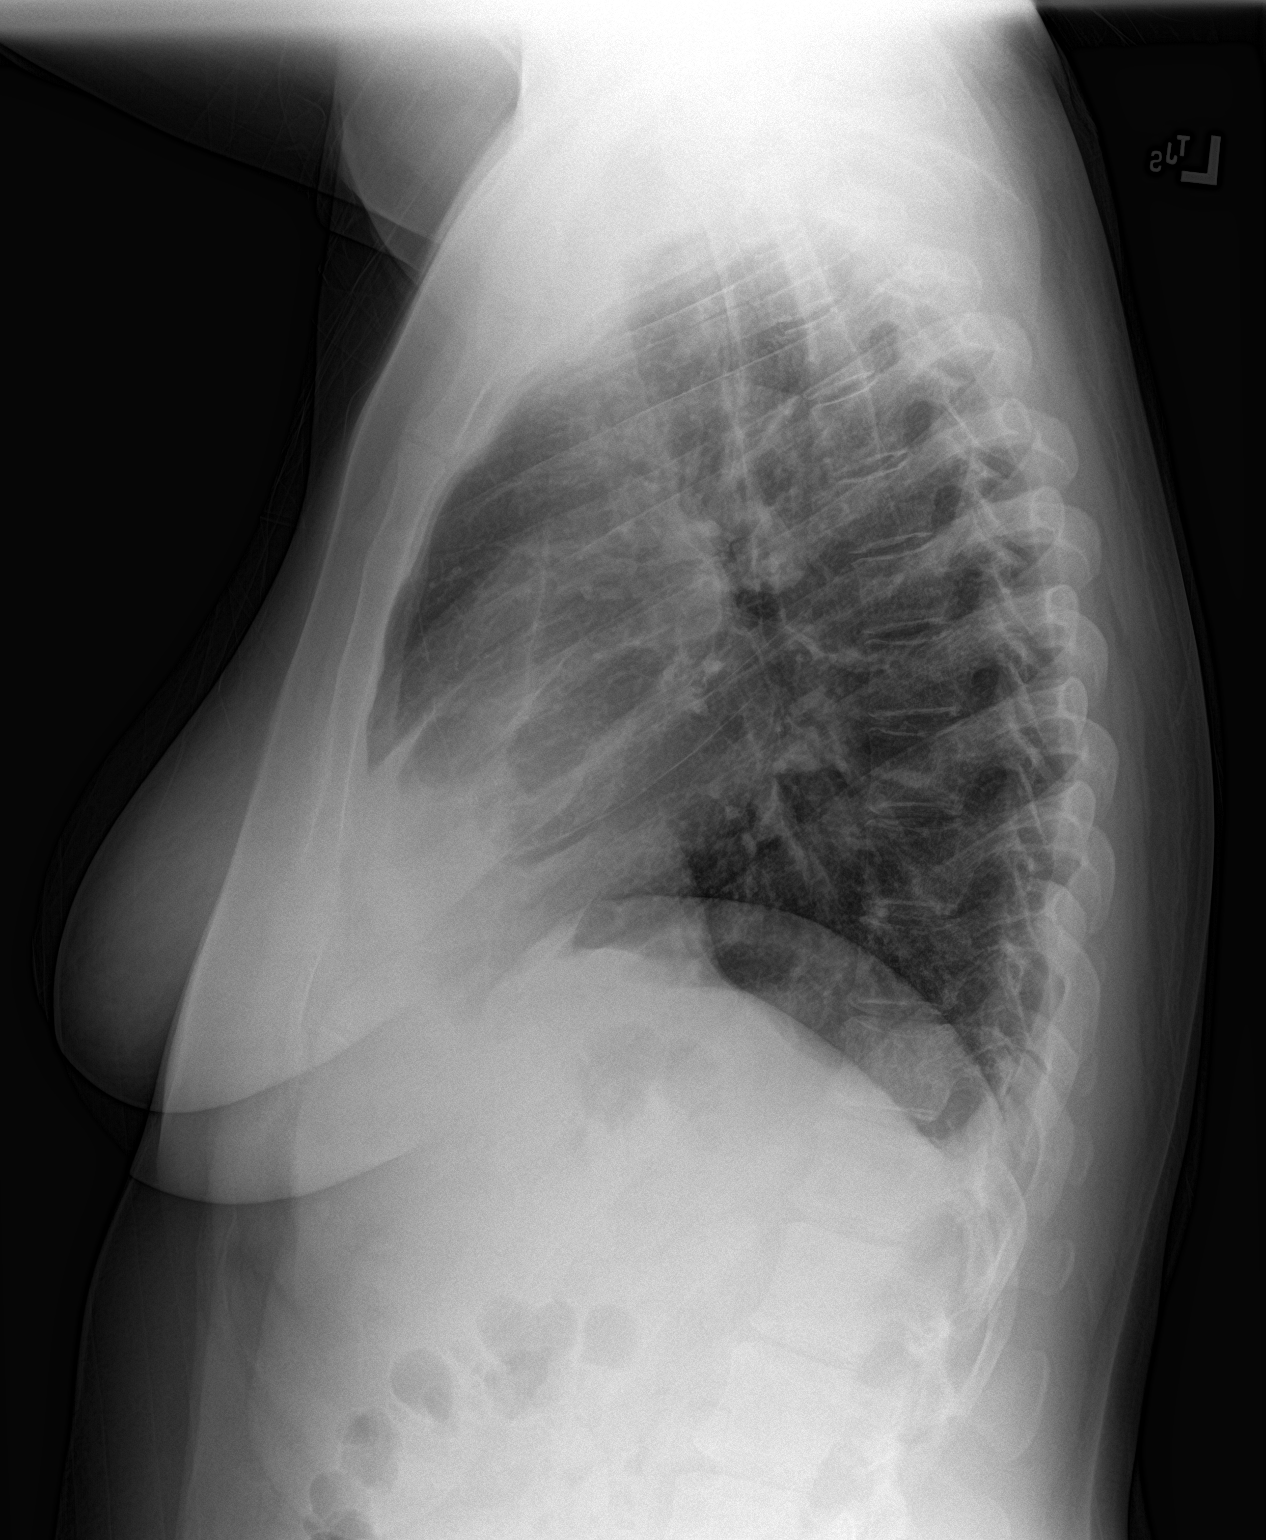

[2 of 2 positions shown; findings below may reference images not displayed]

FINDINGS: Trachea is midline. Heart size stable. Postoperative changes in the
lower right hemithorax with a small right pleural effusion. Right
basilar atelectasis. Left lung is grossly clear. No pneumothorax.
IMPRESSION: Postoperative changes in the right hemithorax with right basilar
atelectasis and a small right pleural effusion, stable.

## 2019-10-02 IMAGING — CT CT ABDOMEN AND PELVIS WITH CONTRAST
2 of 4 series · 15 of 46 positions shown, 17 images · IV contrast (omnipaque)
Comparison: Chest CT 11/12/2017.

CLINICAL DATA: Vomiting.  Lower abdominal pain.

EXAM:
CT ABDOMEN AND PELVIS WITH CONTRAST
TECHNIQUE: Multidetector CT imaging of the abdomen and pelvis was performed
using the standard protocol following bolus administration of
intravenous contrast.
CONTRAST:  100mL OMNIPAQUE IOHEXOL 300 MG/ML  SOLN

[Series 2: routine abd/pel with · axial · 0.72mm/px · z∈[-442,-42]mm · 12 of 96 slices shown, 14 images]
[im 8/96  soft-tissue]
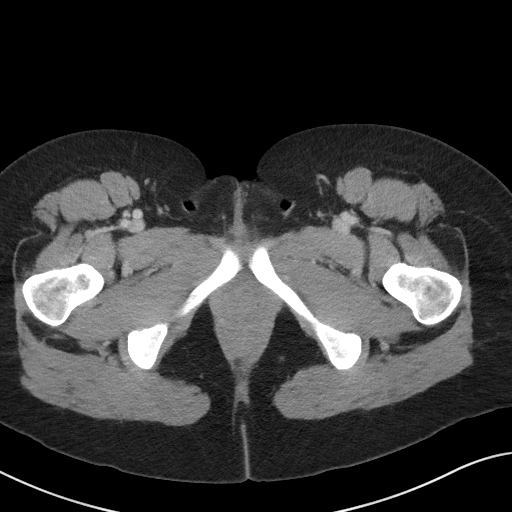
[im 8/96  bone]
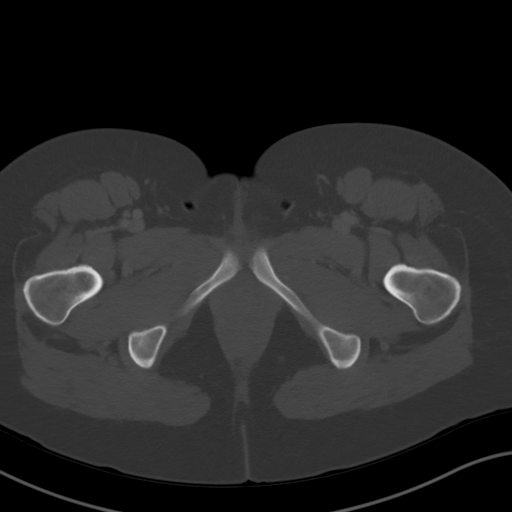
[im 15/96  soft-tissue]
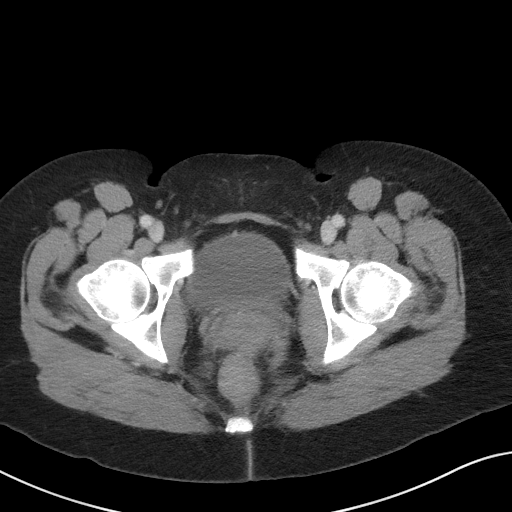
[im 22/96  soft-tissue]
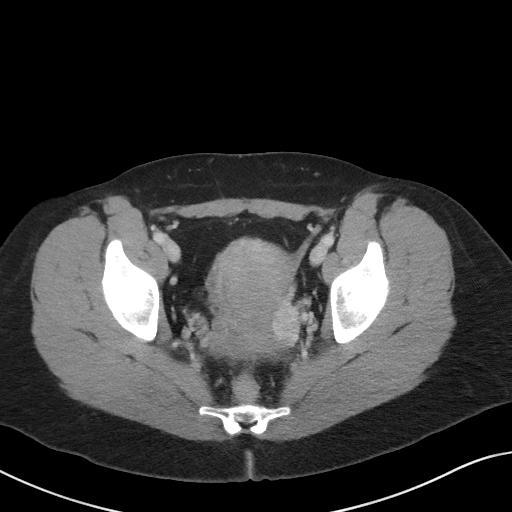
[im 30/96  soft-tissue]
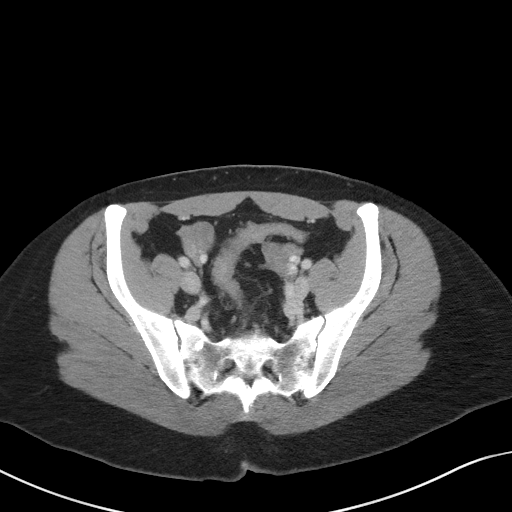
[im 37/96  soft-tissue]
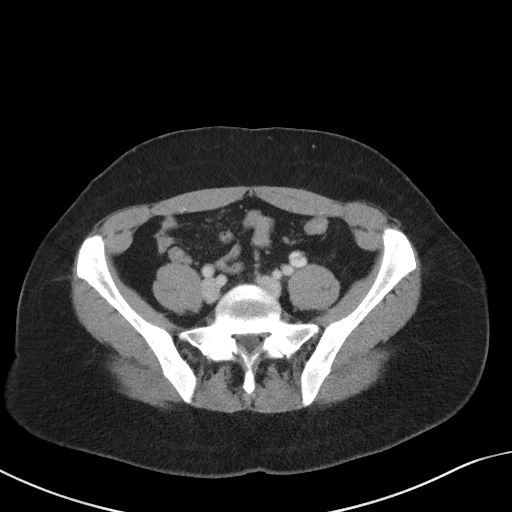
[im 44/96  soft-tissue]
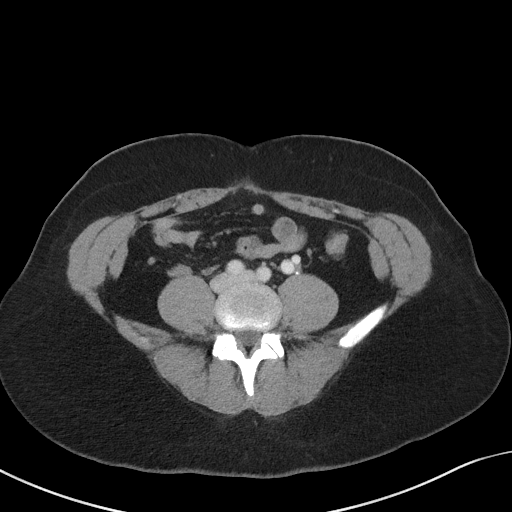
[im 52/96  soft-tissue]
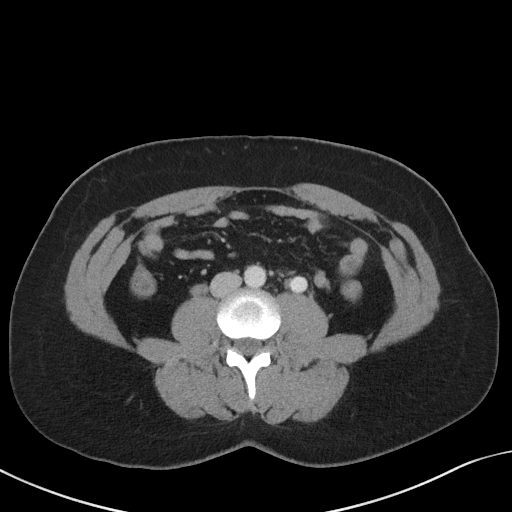
[im 59/96  soft-tissue]
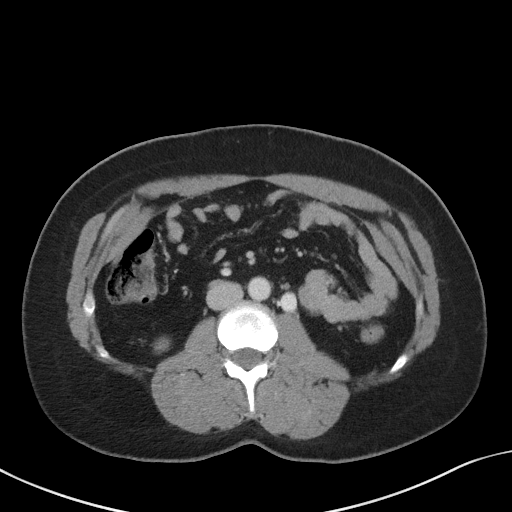
[im 66/96  soft-tissue]
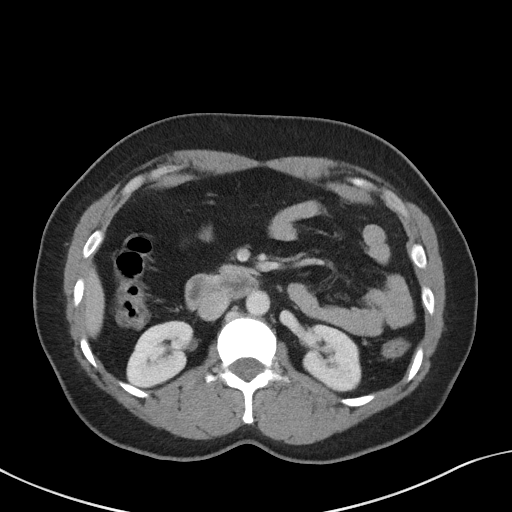
[im 66/96  bone]
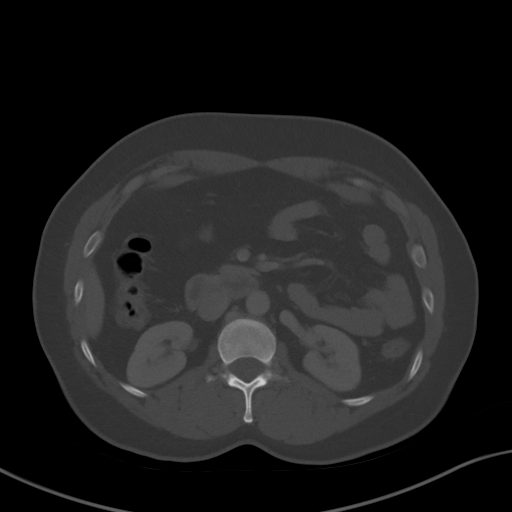
[im 74/96  soft-tissue]
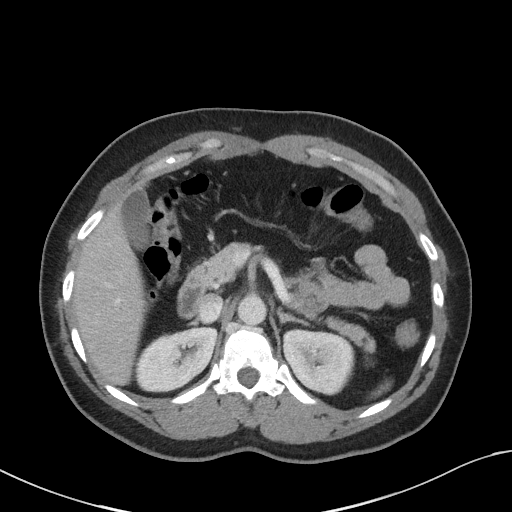
[im 81/96  soft-tissue]
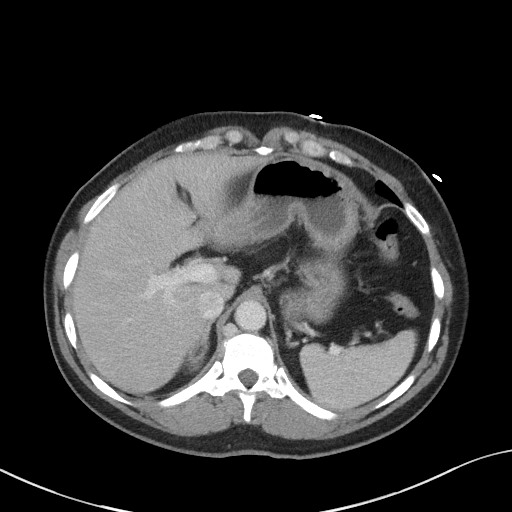
[im 88/96  soft-tissue]
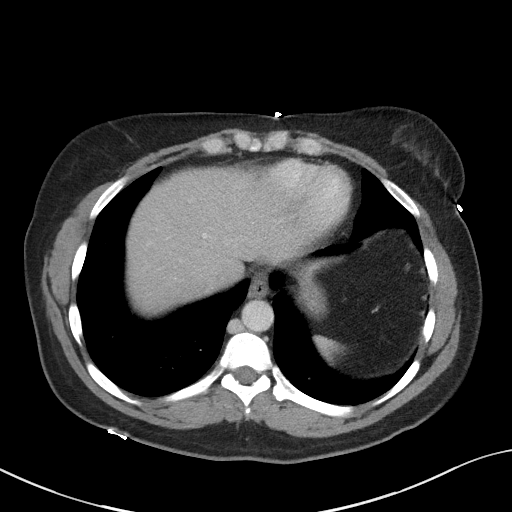

[Series 5: coronal st · coronal · 0.69mm/px · 3 of 84 slices shown]
[im 28/84  soft-tissue]
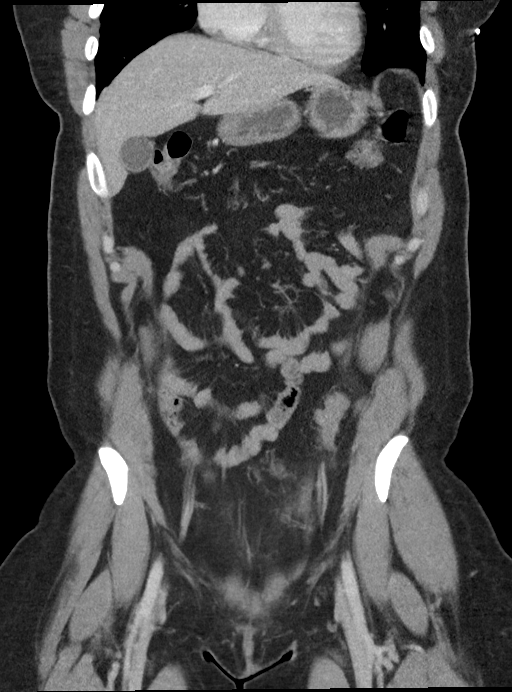
[im 37/84  soft-tissue]
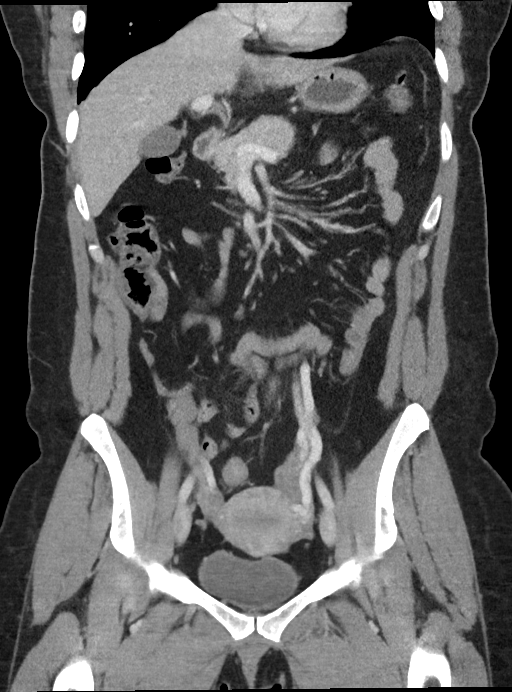
[im 47/84  soft-tissue]
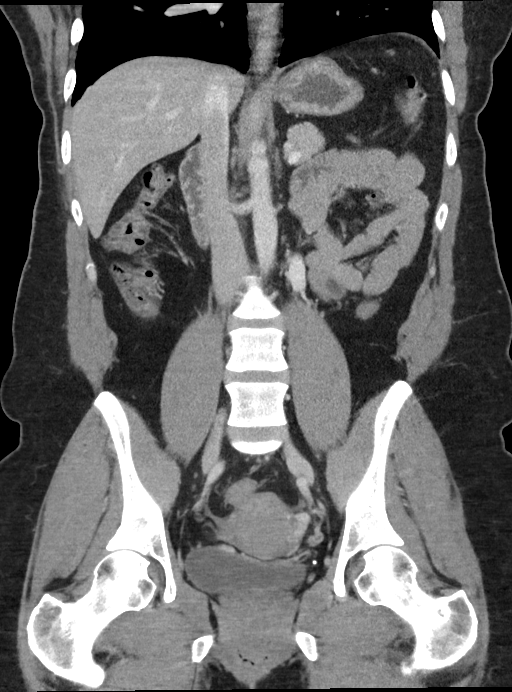

[15 of 46 positions shown; findings below may reference images not displayed]

FINDINGS: Lower chest: Architectural distortion with parenchymal staple line
noted right middle lobe, incompletely visualized similar to prior
chest CT.

Hepatobiliary: No suspicious focal abnormality within the liver
parenchyma. There is no evidence for gallstones, gallbladder wall
thickening, or pericholecystic fluid. No intrahepatic or
extrahepatic biliary dilation.

Pancreas: No focal mass lesion. No dilatation of the main duct. No
intraparenchymal cyst. No peripancreatic edema.

Spleen: No splenomegaly. No focal mass lesion.

Adrenals/Urinary Tract: No adrenal nodule or mass. Kidneys
unremarkable. No evidence for hydroureter. The urinary bladder
appears normal for the degree of distention.

Stomach/Bowel: Stomach is unremarkable. No gastric wall thickening.
No evidence of outlet obstruction. Duodenum is normally positioned
as is the ligament of Treitz. No small bowel wall thickening. No
small bowel dilatation. The terminal ileum is normal. The appendix
is normal. No gross colonic mass. No colonic wall thickening.

Vascular/Lymphatic: No abdominal aortic aneurysm. No abdominal
aortic atherosclerotic calcification. There is no gastrohepatic or
hepatoduodenal ligament lymphadenopathy. No intraperitoneal or
retroperitoneal lymphadenopathy. No pelvic sidewall lymphadenopathy.

Reproductive: The uterus is unremarkable. There is no adnexal mass.
Prominent left gonadal venous anatomy.

Other: No intraperitoneal free fluid.

Musculoskeletal: No worrisome lytic or sclerotic osseous
abnormality.
IMPRESSION: 1. No acute findings in the abdomen or pelvis. Specifically, no
findings to explain the patient's history of vomiting and pain.
2. Prominent left gonadal venous anatomy, nonspecific. In the
appropriate clinical setting, pelvic congestion syndrome could be
considered.
3. Surgical changes anterior right lung base.

## 2019-10-02 IMAGING — US ULTRASOUND ABDOMEN LIMITED
1 series · 14 of 25 positions shown · non-contrast
Comparison: None.

CLINICAL DATA: Nausea.

EXAM:
ULTRASOUND ABDOMEN LIMITED RIGHT UPPER QUADRANT

[Series 2: ultrasound abdomen limited · 14 of 31 slices shown]
[im 1/31]
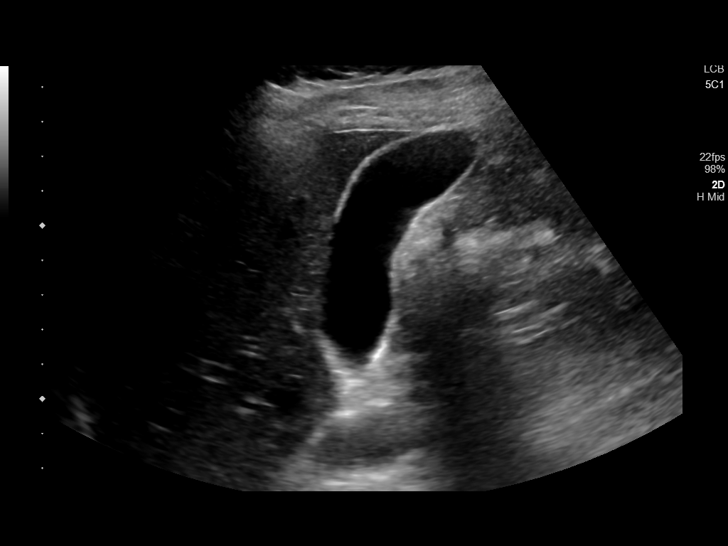
[im 3/31]
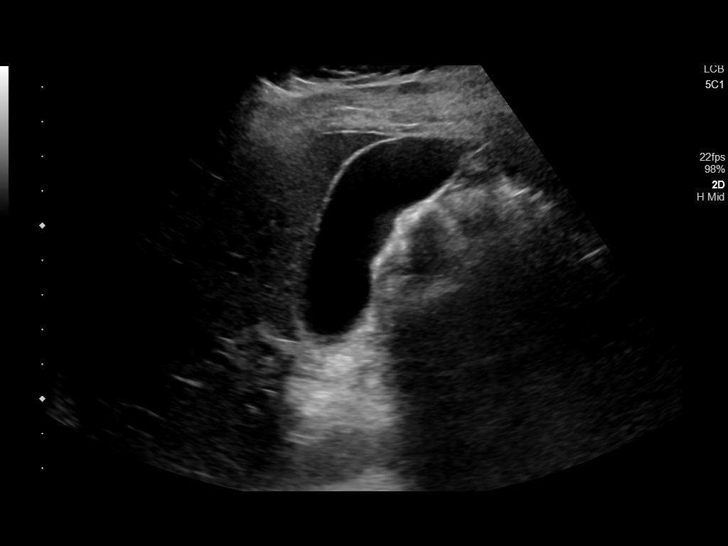
[im 6/31]
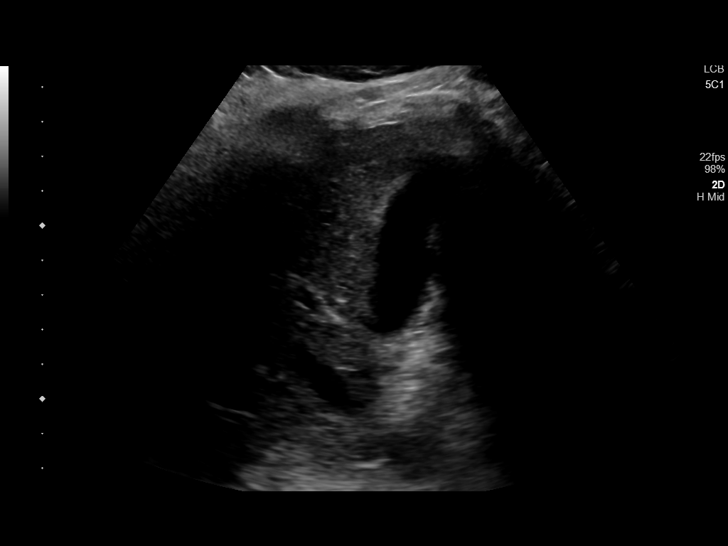
[im 8/31]
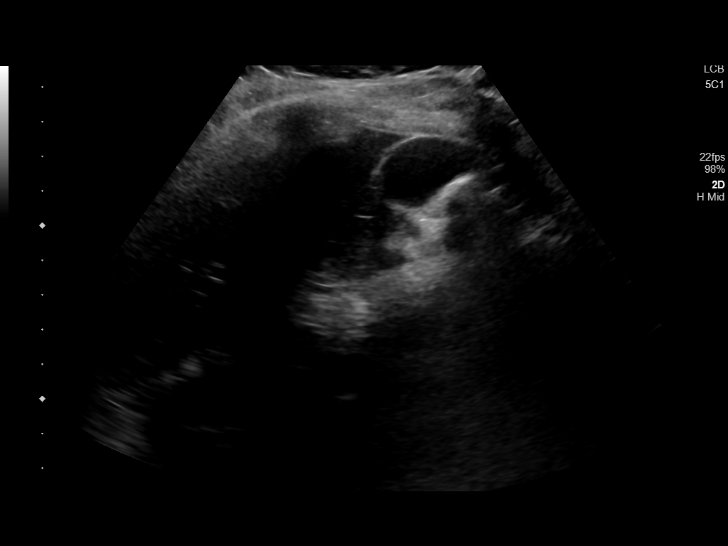
[im 11/31]
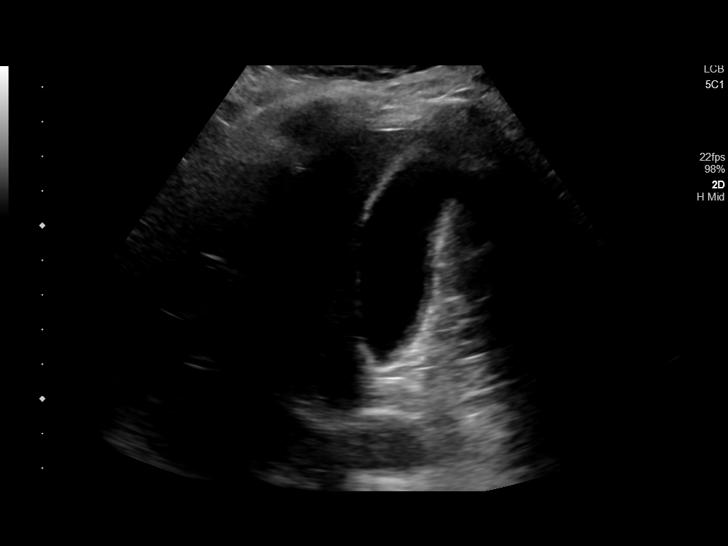
[im 12/31]
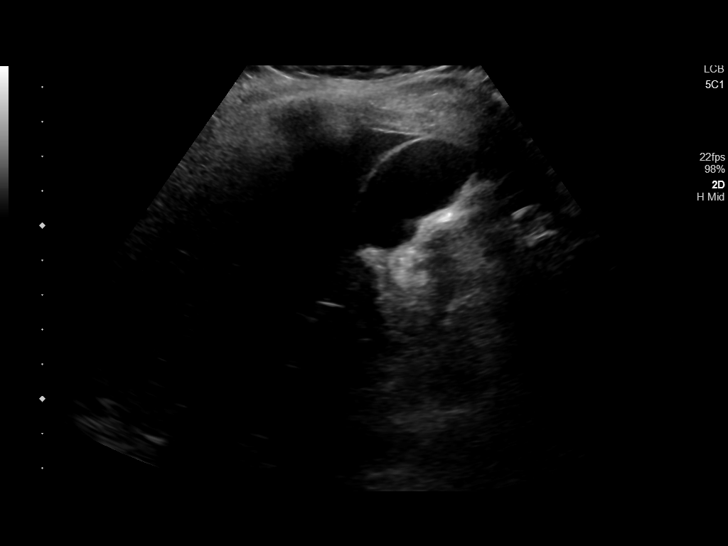
[im 14/31]
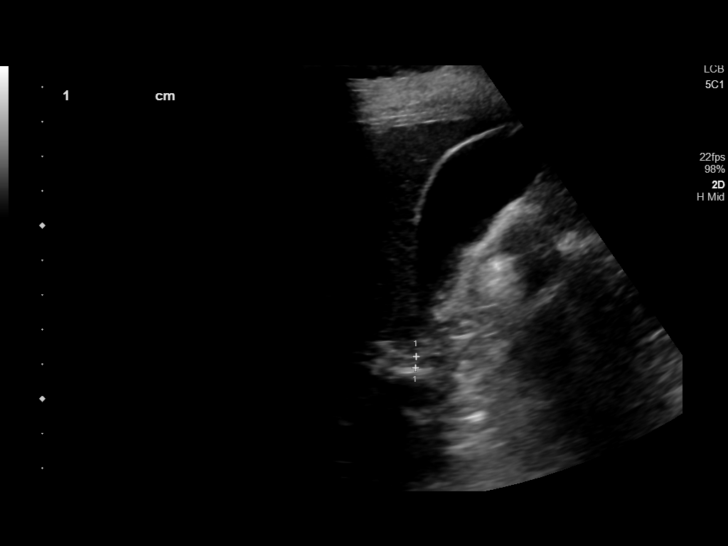
[im 17/31]
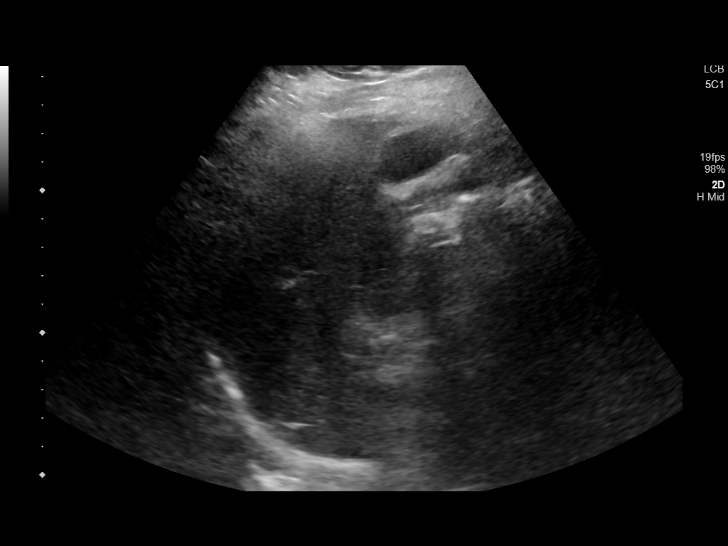
[im 19/31]
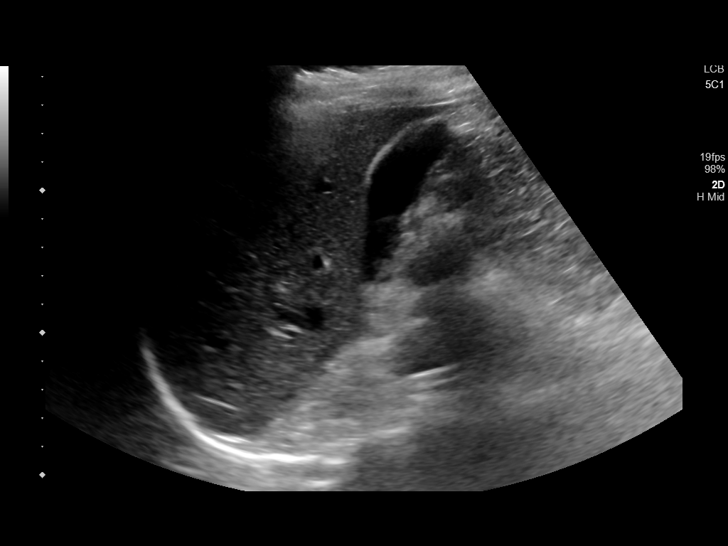
[im 21/31]
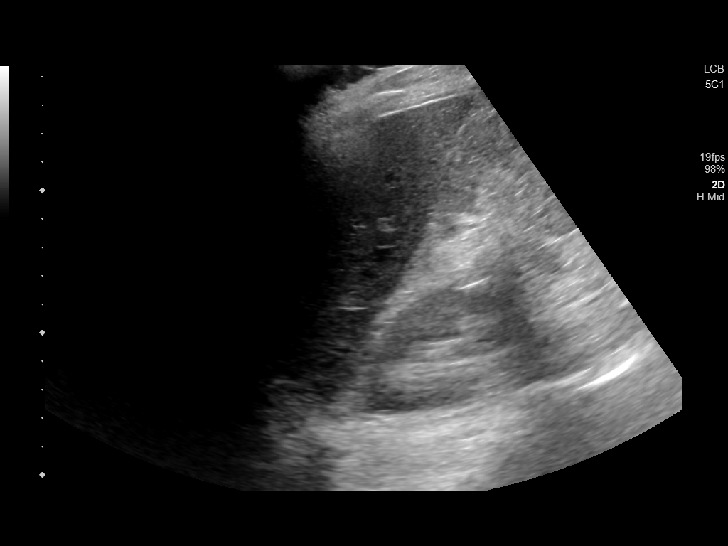
[im 23/31]
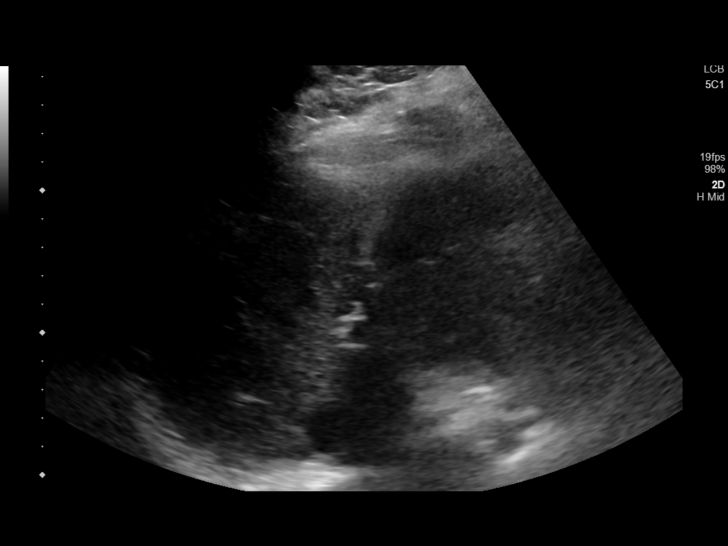
[im 26/31]
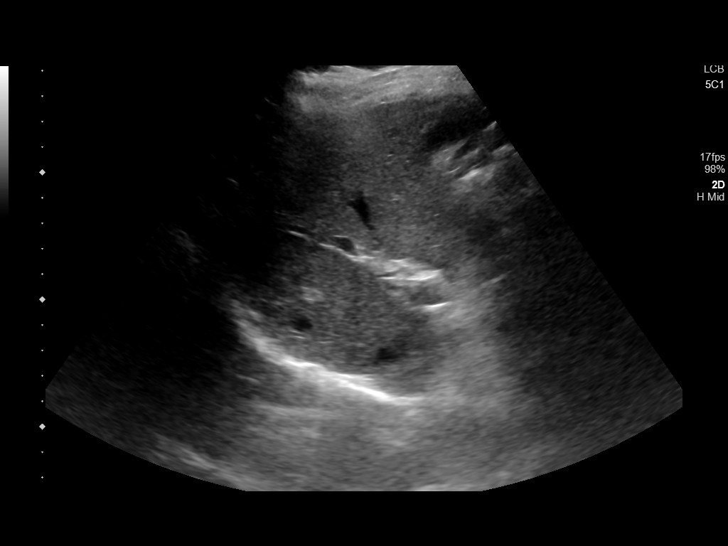
[im 28/31]
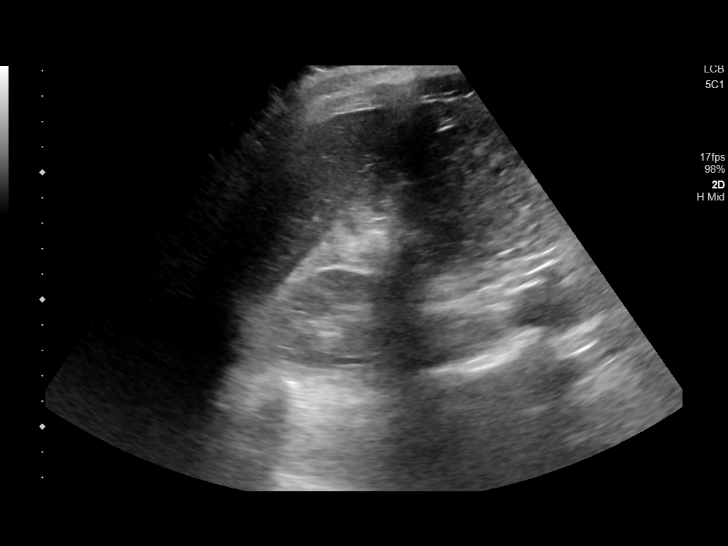
[im 31/31]
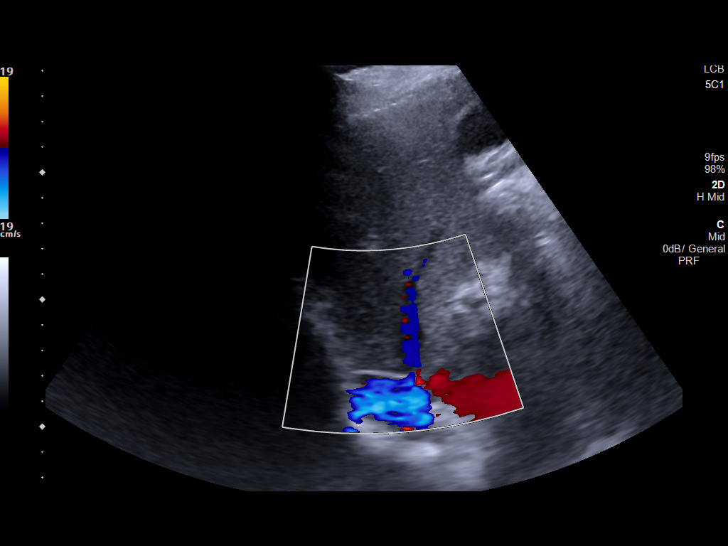

[14 of 25 positions shown; findings below may reference images not displayed]

FINDINGS: Gallbladder:

No gallstones or gallbladder wall thickening. No pericholecystic
fluid. The sonographer reports no sonographic Murphy's sign.

Common bile duct:

Diameter: 3 mm

Liver:

No focal lesion identified. Within normal limits in parenchymal
echogenicity. Portal vein is patent on color Doppler imaging with
normal direction of blood flow towards the liver.
IMPRESSION: Unremarkable right upper quadrant ultrasound. No findings to explain
the patient's history of nausea.

## 2020-02-20 ENCOUNTER — Other Ambulatory Visit: Payer: Self-pay

## 2020-02-20 ENCOUNTER — Emergency Department: Payer: Medicaid Other

## 2020-02-20 ENCOUNTER — Emergency Department
Admission: EM | Admit: 2020-02-20 | Discharge: 2020-02-20 | Disposition: A | Discharge status: Home / Self Care | Payer: Medicaid Other | Attending: Emergency Medicine | Admitting: Emergency Medicine

## 2020-02-20 DIAGNOSIS — R079 Chest pain, unspecified: Secondary | ICD-10-CM | POA: Diagnosis present

## 2020-02-20 LAB — CBC
HCT: 37.9 % (ref 36.0–46.0)
Hemoglobin: 12.1 g/dL (ref 12.0–15.0)
MCH: 25.4 pg — ABNORMAL LOW (ref 26.0–34.0)
MCHC: 31.9 g/dL (ref 30.0–36.0)
MCV: 79.6 fL — ABNORMAL LOW (ref 80.0–100.0)
Platelets: 273 10*3/uL (ref 150–400)
RBC: 4.76 MIL/uL (ref 3.87–5.11)
RDW: 13.4 % (ref 11.5–15.5)
WBC: 6.5 10*3/uL (ref 4.0–10.5)
nRBC: 0 % (ref 0.0–0.2)

## 2020-02-20 LAB — BASIC METABOLIC PANEL
Anion gap: 8 (ref 5–15)
BUN: 9 mg/dL (ref 6–20)
CO2: 24 mmol/L (ref 22–32)
Calcium: 8.8 mg/dL — ABNORMAL LOW (ref 8.9–10.3)
Chloride: 104 mmol/L (ref 98–111)
Creatinine, Ser: 1.06 mg/dL — ABNORMAL HIGH (ref 0.44–1.00)
GFR, Estimated: >60 mL/min (ref >60)
Glucose, Bld: 105 mg/dL — ABNORMAL HIGH (ref 70–99)
Potassium: 3.3 mmol/L — ABNORMAL LOW (ref 3.5–5.1)
Sodium: 136 mmol/L (ref 135–145)

## 2020-02-20 LAB — URINALYSIS, COMPLETE (UACMP) WITH MICROSCOPIC
Bilirubin Urine: NEGATIVE
Glucose, UA: NEGATIVE mg/dL
Hgb urine dipstick: NEGATIVE
Ketones, ur: NEGATIVE mg/dL
Leukocytes,Ua: NEGATIVE
Nitrite: NEGATIVE
Protein, ur: NEGATIVE mg/dL
Specific Gravity, Urine: 1.019 (ref 1.005–1.030)
pH: 6 (ref 5.0–8.0)

## 2020-02-20 LAB — TROPONIN I (HIGH SENSITIVITY)
Troponin I (High Sensitivity): 2 ng/L (ref <18)
Troponin I (High Sensitivity): 3 ng/L (ref <18)

## 2020-02-20 LAB — LIPASE, BLOOD: Lipase: 49 U/L (ref 11–51)

## 2020-02-20 LAB — POC URINE PREG, ED: Preg Test, Ur: NEGATIVE

## 2020-02-20 MED ORDER — METHOCARBAMOL 500 MG PO TABS: 500.0000 mg | ORAL_TABLET | Freq: Three times a day (TID) | ORAL | 0 refills | Status: DC | PRN

## 2020-02-20 MED ORDER — MELOXICAM 15 MG PO TABS: 15.0000 mg | ORAL_TABLET | Freq: Every day | ORAL | 2 refills | Status: DC

## 2020-02-20 MED ORDER — MELOXICAM 15 MG PO TABS: 15.0000 mg | ORAL_TABLET | Freq: Every day | ORAL | 2 refills | Status: AC

## 2020-02-20 MED ORDER — METHOCARBAMOL 500 MG PO TABS: 500.0000 mg | ORAL_TABLET | Freq: Three times a day (TID) | ORAL | 0 refills | Status: AC | PRN

## 2020-02-20 NOTE — ED Provider Notes (Signed)
[facility]-EMERGENCY DEPARTMENT  ____________________________________________  Time seen: Approximately 8:14 PM  I have reviewed the triage vital signs and the nursing notes.   HISTORY  Chief Complaint Chest Pain   Historian Patient     HPI Theresa Powell is a 40 y.o. female with a history of GERD, presents to the emergency department with intermittent right-sided chest pain that radiates to the back for the past 2 to 3 days.  Patient has a history of pneumothorax and was concerned for similar.  She denies current shortness of breath.  She states that she has no pain at rest and sometimes has increased discomfort with exertion but not always.  She denies daily smoking.  No prior history of DVT or PE.  No recent surgeries or recent travel.  She denies a history of cardiac issues in the past.  No other alleviating measures have been attempted.   Past Medical History:  Diagnosis Date  . Arthritis   . GERD (gastroesophageal reflux disease)   . Headache(784.0)    "migraines"  . Heart murmur    "as a child"     Immunizations up to date:  Yes.     Past Medical History:  Diagnosis Date  . Arthritis   . GERD (gastroesophageal reflux disease)   . Headache(784.0)    "migraines"  . Heart murmur    "as a child"    Patient Active Problem List   Diagnosis Date Noted  . Tension pneumothorax, spontaneous   . Pneumothorax 10/02/2017  . Pneumothorax on right 10/01/2017  . Depression, major, recurrent, moderate (HCC) 02/23/2014  . Migraine without aura and without status migrainosus, not intractable 02/23/2014    Past Surgical History:  Procedure Laterality Date  . DILATION AND CURETTAGE OF UTERUS    . ORIF ANKLE FRACTURE Left 03/25/2012   Procedure: OPEN REDUCTION INTERNAL FIXATION (ORIF) ANKLE FRACTURE;  Surgeon: Nadara Mustard, MD;  Location: [facility] OR;  Service: Orthopedics;  Laterality: Left;  Open Reduction Internal Fixation Left Ankle  . THORACOTOMY N/A 10/08/2017   Procedure:  THORACOTOMY/ LOBECTOMY;  Surgeon: Henrene Dodge, MD;  Location: [facility] ORS;  Service: General;  Laterality: N/A;  . TUBAL LIGATION      Prior to Admission medications   Medication Sig Start Date End Date Taking? Authorizing Provider  acetaminophen (TYLENOL) 500 MG tablet Take 500 mg by mouth every 6 (six) hours as needed.    [provider]  dicyclomine (BENTYL) 20 MG tablet Take 1 tablet (20 mg total) by mouth 3 (three) times daily as needed for spasms. 05/05/18 05/05/19  Triplett, Rulon Eisenmenger B, FNP  lansoprazole (PREVACID SOLUTAB) 30 MG disintegrating tablet Take 1 tablet (30 mg total) by mouth daily at 12 noon. 05/05/18   Triplett, Rulon Eisenmenger B, FNP  meloxicam (MOBIC) 15 MG tablet Take 1 tablet (15 mg total) by mouth daily. 02/20/20 02/19/21  Orvil Feil, PA-C  methocarbamol (ROBAXIN) 500 MG tablet Take 1 tablet (500 mg total) by mouth every 8 (eight) hours as needed for up to 5 days. 02/20/20 02/25/20  Orvil Feil, PA-C  promethazine (PHENERGAN) 12.5 MG tablet Take 1 tablet (12.5 mg total) by mouth every 6 (six) hours as needed for nausea or vomiting. 05/05/18   Triplett, Cari B, FNP  sertraline (ZOLOFT) 100 MG tablet Take 100 mg by mouth daily. 03/08/18   [provider]  traZODone (DESYREL) 100 MG tablet Take 100-200 mg by mouth at bedtime as needed for sleep. 04/05/18   [provider]  venlafaxine XR (  EFFEXOR-XR) 75 MG 24 hr capsule Take 75 mg by mouth daily with breakfast. 05/01/18   [provider]    Allergies Hydrocodone-acetaminophen  No family history on file.  Social History Social History   Tobacco Use  . Smoking status: Never Smoker  . Smokeless tobacco: Never Used  Vaping Use  . Vaping Use: Never used  Substance Use Topics  . Drug use: No     Review of Systems  Constitutional: No fever/chills Eyes:  No discharge ENT: No upper respiratory complaints. Respiratory: no cough. No SOB/ use of accessory muscles to breath Cardiac: Patient has right  sided chest pain.  Gastrointestinal:   No nausea, no vomiting.  No diarrhea.  No constipation. Musculoskeletal: Negative for musculoskeletal pain. Skin: Negative for rash, abrasions, lacerations, ecchymosis.    ____________________________________________   PHYSICAL EXAM:  VITAL SIGNS: ED Triage Vitals  Enc Vitals Group     BP 02/20/20 1528 (!) 156/97     Pulse Rate 02/20/20 1528 74     Resp 02/20/20 1528 16     Temp 02/20/20 1528 99.2 F (37.3 C)     Temp Source 02/20/20 1528 Oral     SpO2 02/20/20 1528 100 %     Weight 02/20/20 1529 210 lb (95.3 kg)     Height 02/20/20 1529 5\' 6"  (1.676 m)     Head Circumference --      Peak Flow --      Pain Score 02/20/20 1529 5     Pain Loc --      Pain Edu? --      Excl. in GC? --      Constitutional: Alert and oriented. Well appearing and in no acute distress. Eyes: Conjunctivae are normal. PERRL. EOMI. Head: Atraumatic. ENT:      Nose: No congestion/rhinnorhea.      Mouth/Throat: Mucous membranes are moist.  Neck: No stridor.  No cervical spine tenderness to palpation. Cardiovascular: Normal rate, regular rhythm. Normal S1 and S2.  Good peripheral circulation. No reproducible chest wall pain to palpation.  Respiratory: Normal respiratory effort without tachypnea or retractions. Lungs CTAB. Good air entry to the bases with no decreased or absent breath sounds Gastrointestinal: Bowel sounds x 4 quadrants. Soft and nontender to palpation. No guarding or rigidity. No distention. Musculoskeletal: Full range of motion to all extremities. No obvious deformities noted Neurologic:  Normal for age. No gross focal neurologic deficits are appreciated.  Skin:  Skin is warm, dry and intact. No rash noted. Psychiatric: Mood and affect are normal for age. Speech and behavior are normal.   ____________________________________________   LABS (all labs ordered are listed, but only abnormal results are displayed)  Labs Reviewed  BASIC  METABOLIC PANEL - Abnormal; Notable for the following components:      Result Value   Potassium 3.3 (*)    Glucose, Bld 105 (*)    Creatinine, Ser 1.06 (*)    Calcium 8.8 (*)    All other components within normal limits  CBC - Abnormal; Notable for the following components:   MCV 79.6 (*)    MCH 25.4 (*)    All other components within normal limits  URINALYSIS, COMPLETE (UACMP) WITH MICROSCOPIC - Abnormal; Notable for the following components:   Color, Urine YELLOW (*)    APPearance CLEAR (*)    Bacteria, UA RARE (*)    All other components within normal limits  LIPASE, BLOOD  POC URINE PREG, ED  TROPONIN I (HIGH SENSITIVITY)  TROPONIN I (HIGH SENSITIVITY)   ____________________________________________  EKG   ____________________________________________  RADIOLOGY Geraldo Pitter, personally viewed and evaluated these images (plain radiographs) as part of my medical decision making, as well as reviewing the written report by the radiologist.  DG Chest 2 View  Result Date: 02/20/2020 CLINICAL DATA:  CHEST PAIN.  SHORTNESS OF BREATH. EXAM: CHEST - 2 VIEW COMPARISON:  March 01, 2018. FINDINGS: The heart size and mediastinal contours are within normal limits. Both lungs are clear. No visible pleural effusions or pneumothorax. Postsurgical changes of the right lung base, similar to prior. No acute osseous abnormality. IMPRESSION: No active cardiopulmonary disease. Electronically Signed   By: Feliberto Harts MD   On: 02/20/2020 16:05    ____________________________________________    PROCEDURES  Procedure(s) performed:     Procedures     Medications - No data to display   ____________________________________________   INITIAL IMPRESSION / ASSESSMENT AND PLAN / ED COURSE  Pertinent labs & imaging results that were available during my care of the patient were reviewed by me and considered in my medical decision making (see chart for details).      Assessment and Plan: Chest pain:  40 year old female presents to the emergency department with right-sided intermittent chest pain that radiates to the back.  Patient was hypertensive at triage but vital signs were otherwise reassuring.  EKG revealed normal sinus rhythm without ST segment elevation or other apparent arrhythmia.  Chest x-ray revealed no signs of pneumothorax.  Both sets of troponin were within reference range.  Urine pregnancy test was negative.  Lipase was within reference range.  Patient has less than 2% chance of having PE according to [facility] criteria.  Recommended meloxicam and Robaxin at home for likely musculoskeletal source of pain.  Patient was given strict return precautions to return to the emergency department for reevaluation if symptoms were to change or worsen.   ____________________________________________  FINAL CLINICAL IMPRESSION(S) / ED DIAGNOSES  Final diagnoses:  Nonspecific chest pain      NEW MEDICATIONS STARTED DURING THIS VISIT:  ED Discharge Orders         Ordered    meloxicam (MOBIC) 15 MG tablet  Daily,   Status:  Discontinued        02/20/20 2049    methocarbamol (ROBAXIN) 500 MG tablet  Every 8 hours PRN,   Status:  Discontinued        02/20/20 2049    meloxicam (MOBIC) 15 MG tablet  Daily        02/20/20 2110    methocarbamol (ROBAXIN) 500 MG tablet  Every 8 hours PRN        02/20/20 2110              This chart was dictated using voice recognition software/Dragon. Despite best efforts to proofread, errors can occur which can change the meaning. Any change was purely unintentional.     Gasper Lloyd 02/20/20 2215    Phineas Semen, MD 02/20/20 [phone]

## 2020-02-20 NOTE — ED Triage Notes (Signed)
Patient c/o sob X1 week with shooting chest pain that radiates to back. HX right lung collapse.

## 2020-02-20 NOTE — Discharge Instructions (Signed)
Take Meloxicam and Robaxin as directed.  Return with new or worsening symptoms.

## 2020-02-25 ENCOUNTER — Other Ambulatory Visit
Admission: RE | Admit: 2020-02-25 | Discharge: 2020-02-25 | Disposition: A | Discharge status: Home / Self Care | Payer: Medicaid Other | Source: Ambulatory Visit | Attending: Internal Medicine | Admitting: Internal Medicine

## 2020-02-25 DIAGNOSIS — R079 Chest pain, unspecified: Secondary | ICD-10-CM | POA: Insufficient documentation

## 2020-02-25 LAB — FIBRIN DERIVATIVES D-DIMER (ARMC ONLY): Fibrin derivatives D-dimer (ARMC): 310.65 ng/mL (FEU) (ref 0.00–499.00)

## 2020-07-20 ENCOUNTER — Other Ambulatory Visit: Payer: Self-pay

## 2020-07-20 ENCOUNTER — Emergency Department
Admission: EM | Admit: 2020-07-20 | Discharge: 2020-07-20 | Disposition: A | Discharge status: Home / Self Care | Payer: Medicaid Other | Attending: Emergency Medicine | Admitting: Emergency Medicine

## 2020-07-20 ENCOUNTER — Emergency Department: Payer: Medicaid Other

## 2020-07-20 DIAGNOSIS — R079 Chest pain, unspecified: Secondary | ICD-10-CM | POA: Insufficient documentation

## 2020-07-20 DIAGNOSIS — Z20822 Contact with and (suspected) exposure to covid-19: Secondary | ICD-10-CM | POA: Insufficient documentation

## 2020-07-20 DIAGNOSIS — R0602 Shortness of breath: Secondary | ICD-10-CM | POA: Insufficient documentation

## 2020-07-20 LAB — BASIC METABOLIC PANEL
Anion gap: 5 (ref 5–15)
BUN: 11 mg/dL (ref 6–20)
CO2: 26 mmol/L (ref 22–32)
Calcium: 8.8 mg/dL — ABNORMAL LOW (ref 8.9–10.3)
Chloride: 107 mmol/L (ref 98–111)
Creatinine, Ser: 1.33 mg/dL — ABNORMAL HIGH (ref 0.44–1.00)
GFR, Estimated: 52 mL/min — ABNORMAL LOW (ref >60)
Glucose, Bld: 103 mg/dL — ABNORMAL HIGH (ref 70–99)
Potassium: 3.5 mmol/L (ref 3.5–5.1)
Sodium: 138 mmol/L (ref 135–145)

## 2020-07-20 LAB — CBC
HCT: 38 % (ref 36.0–46.0)
Hemoglobin: 12.5 g/dL (ref 12.0–15.0)
MCH: 26.3 pg (ref 26.0–34.0)
MCHC: 32.9 g/dL (ref 30.0–36.0)
MCV: 79.8 fL — ABNORMAL LOW (ref 80.0–100.0)
Platelets: 247 10*3/uL (ref 150–400)
RBC: 4.76 MIL/uL (ref 3.87–5.11)
RDW: 13.8 % (ref 11.5–15.5)
WBC: 4.2 10*3/uL (ref 4.0–10.5)
nRBC: 0 % (ref 0.0–0.2)

## 2020-07-20 LAB — TROPONIN I (HIGH SENSITIVITY): Troponin I (High Sensitivity): <2 ng/L (ref <18)

## 2020-07-20 LAB — RESP PANEL BY RT-PCR (FLU A&B, COVID) ARPGX2
Influenza A by PCR: NEGATIVE
Influenza B by PCR: NEGATIVE
SARS Coronavirus 2 by RT PCR: NEGATIVE

## 2020-07-20 NOTE — ED Triage Notes (Signed)
Pt comes with c/o CP that is dull on left side. Pt also states SOB and numbness in bilateral arms. Pt states numbness in head. Pt states this all started SAturday and was worse that day.

## 2020-07-20 NOTE — ED Provider Notes (Signed)
[facility] Emergency Department Provider Note  ____________________________________________   Event Date/Time   First MD Initiated Contact with Patient 07/20/20 1140     (approximate)  I have reviewed the triage vital signs and the nursing notes.   HISTORY  Chief Complaint Chest Pain    HPI Theresa Powell is a 40 y.o. female with prior PTX and had to remove part of the lung who comes in with chest pain.  Patient reports that on Saturday she was lifting heavy boxes and she was moving all of her stuff and she was outside and was very hot outside.  She reports developing some chest pain and some full head tingling/headache as well as arm tingling worse in the left than the right arm but was present in both.  She also reports a little bit of shortness of breath on and off.  She states that her symptoms of gotten a lot better but she had called her primary care doctor who told her to come into the emergency room to be evaluated.  She denies any chest pain at this time.  No shortness of breath at this time.  Denies any leg swelling, recent surgeries, recent long travel, birth control, estrogen use.  Is COVID vaccinated.  She also reports that her tingling is now completely gone. Denies any chest pain at this time but reports that she will occasionally get the pain with certain movements.       Past Medical History:  Diagnosis Date   Arthritis    GERD (gastroesophageal reflux disease)    Headache(784.0)    "migraines"   Heart murmur    "as a child"    Patient Active Problem List   Diagnosis Date Noted   Tension pneumothorax, spontaneous    Pneumothorax 10/02/2017   Pneumothorax on right 10/01/2017   Depression, major, recurrent, moderate (HCC) 02/23/2014   Migraine without aura and without status migrainosus, not intractable 02/23/2014    Past Surgical History:  Procedure Laterality Date   DILATION AND CURETTAGE OF UTERUS     ORIF ANKLE FRACTURE Left  03/25/2012   Procedure: OPEN REDUCTION INTERNAL FIXATION (ORIF) ANKLE FRACTURE;  Surgeon: Nadara Mustard, MD;  Location: [facility] OR;  Service: Orthopedics;  Laterality: Left;  Open Reduction Internal Fixation Left Ankle   THORACOTOMY N/A 10/08/2017   Procedure: THORACOTOMY/ LOBECTOMY;  Surgeon: Henrene Dodge, MD;  Location: [facility] ORS;  Service: General;  Laterality: N/A;   TUBAL LIGATION      Prior to Admission medications   Medication Sig Start Date End Date Taking? Authorizing Provider  acetaminophen (TYLENOL) 500 MG tablet Take 500 mg by mouth every 6 (six) hours as needed.    [provider]  dicyclomine (BENTYL) 20 MG tablet Take 1 tablet (20 mg total) by mouth 3 (three) times daily as needed for spasms. 05/05/18 05/05/19  Triplett, Rulon Eisenmenger B, FNP  lansoprazole (PREVACID SOLUTAB) 30 MG disintegrating tablet Take 1 tablet (30 mg total) by mouth daily at 12 noon. 05/05/18   Triplett, Rulon Eisenmenger B, FNP  meloxicam (MOBIC) 15 MG tablet Take 1 tablet (15 mg total) by mouth daily. 02/20/20 02/19/21  Orvil Feil, PA-C  promethazine (PHENERGAN) 12.5 MG tablet Take 1 tablet (12.5 mg total) by mouth every 6 (six) hours as needed for nausea or vomiting. 05/05/18   Triplett, Cari B, FNP  sertraline (ZOLOFT) 100 MG tablet Take 100 mg by mouth daily. 03/08/18   [provider]  traZODone (DESYREL) 100 MG tablet Take  100-200 mg by mouth at bedtime as needed for sleep. 04/05/18   [provider]  venlafaxine XR (EFFEXOR-XR) 75 MG 24 hr capsule Take 75 mg by mouth daily with breakfast. 05/01/18   [provider]    Allergies Hydrocodone-acetaminophen  No family history on file.  Social History Social History   Tobacco Use   Smoking status: Never   Smokeless tobacco: Never  Vaping Use   Vaping Use: Never used  Substance Use Topics   Drug use: No      Review of Systems Constitutional: No fever/chills Eyes: No visual changes. ENT: No sore throat. Cardiovascular: Positive chest  pain Respiratory: Positive shortness of breath Gastrointestinal: No abdominal pain.  No nausea, no vomiting.  No diarrhea.  No constipation. Genitourinary: Negative for dysuria. Musculoskeletal: Negative for back pain. Skin: Negative for rash. Neurological: Negative for headaches, focal weakness positive tingling All other ROS negative ____________________________________________   PHYSICAL EXAM:  VITAL SIGNS: ED Triage Vitals  Enc Vitals Group     BP 07/20/20 1037 (!) 150/96     Pulse Rate 07/20/20 1037 63     Resp 07/20/20 1037 18     Temp 07/20/20 1037 98.6 F (37 C)     Temp Source 07/20/20 1037 Oral     SpO2 07/20/20 1037 100 %     Weight --      Height --      Head Circumference --      Peak Flow --      Pain Score 07/20/20 1042 4     Pain Loc --      Pain Edu? --      Excl. in GC? --     Constitutional: Alert and oriented. Well appearing and in no acute distress. Eyes: Conjunctivae are normal. EOMI. Head: Atraumatic. Nose: No congestion/rhinnorhea. Mouth/Throat: Mucous membranes are moist.   Neck: No stridor. Trachea Midline. FROM Cardiovascular: Normal rate, regular rhythm. Grossly normal heart sounds.  Good peripheral circulation. Respiratory: Normal respiratory effort.  No retractions. Lungs CTAB. Gastrointestinal: Soft and nontender. No distention. No abdominal bruits.  Musculoskeletal: No lower extremity tenderness nor edema.  No joint effusions. Neurologic:  Normal speech and language. No gross focal neurologic deficits are appreciated.  Cranial 2 through 12 are intact.  Equal strength in arms and legs.  Sensation equal intact throughout. Skin:  Skin is warm, dry and intact. No rash noted. Psychiatric: Mood and affect are normal. Speech and behavior are normal. GU: Deferred   ____________________________________________   LABS (all labs ordered are listed, but only abnormal results are displayed)  Labs Reviewed  BASIC METABOLIC PANEL - Abnormal;  Notable for the following components:      Result Value   Glucose, Bld 103 (*)    Creatinine, Ser 1.33 (*)    Calcium 8.8 (*)    GFR, Estimated 52 (*)    All other components within normal limits  CBC - Abnormal; Notable for the following components:   MCV 79.8 (*)    All other components within normal limits  POC URINE PREG, ED  TROPONIN I (HIGH SENSITIVITY)  TROPONIN I (HIGH SENSITIVITY)   ____________________________________________   ED ECG REPORT I, Concha SeMary E Nalea Salce, the attending physician, personally viewed and interpreted this ECG.  Normal sinus rate of 73, no ST elevation, no T wave inversions, normal intervals ____________________________________________  RADIOLOGY Vela ProseI, Nehemiah Montee E Renna Kilmer, personally viewed and evaluated these images (plain radiographs) as part of my medical decision making, as well as reviewing  the written report by the radiologist.  ED MD interpretation: Scarring noted in the right base from patient's prior pneumothorax, surgery.  No pneumonia  Official radiology report(s): DG Chest 2 View  Result Date: 07/20/2020 CLINICAL DATA:  Chest pain. EXAM: CHEST - 2 VIEW COMPARISON:  02/20/2020. FINDINGS: Mediastinum and hilar structures normal. Low lung volumes with mild right base subsegmental atelectasis and or scarring. No pleural effusion or pneumothorax. No acute bony abnormality. IMPRESSION: Low lung volumes with mild right base subsegmental atelectasis and or scarring. No acute infiltrate. Electronically Signed   By: Maisie Fus  Register   On: 07/20/2020 11:26    ____________________________________________   PROCEDURES  Procedure(s) performed (including Critical Care):  Procedures   ____________________________________________   INITIAL IMPRESSION / ASSESSMENT AND PLAN / ED COURSE   Theresa Powell was evaluated in Emergency Department on 07/20/2020 for the symptoms described in the history of present illness. She was evaluated in the context of the global  COVID-19 pandemic, which necessitated consideration that the patient might be at risk for infection with the SARS-CoV-2 virus that causes COVID-19. Institutional protocols and algorithms that pertain to the evaluation of patients at risk for COVID-19 are in a state of rapid change based on information released by regulatory bodies including the CDC and federal and state organizations. These policies and algorithms were followed during the patient's care in the ED.    Most Likely DDx:  -MSK (atypical chest pain) but will get cardiac markers to evaluate for ACS given risk factors/age -Tingling sensation does not seem to be related to his stroke given it was her entire head and both arms although she does report the right arm is worse than the other.  Currently she denies any symptoms she is currently neurovascularly intact.  I have very low suspicion this is a TIA.  Discussed return precautions if the numbness returns.  Will get COVID testing   DDx that was also considered d/t potential to cause harm, but was found less likely based on history and physical (as detailed above): -PNA (no fevers, cough but CXR to evaluate) -PNX (reassured with equal b/l breath sounds, CXR to evaluate) -Symptomatic anemia (will get H&H) -Pulmonary embolism as no sob at rest, not pleuritic in nature, no hypoxia.  PERC negative -Aortic Dissection as no tearing pain and no radiation to the mid back, pulses equal -Pericarditis no rub on exam, EKG changes or hx to suggest dx -Tamponade (no notable SOB, tachycardic, hypotensive) -Esophageal rupture (no h/o diffuse vomitting/no crepitus)  Kidney function slightly elevated at 1.33 we will have her encourage oral hydration.  12:55 PM patient is declining pregnancy test.  States that she has a tubal ligation denies any abdominal pain does not want to wait for this testing  Offered patient medications for home but she declined.  She states that she is already feeling a lot  better  I discussed the provisional nature of ED diagnosis, the treatment so far, the ongoing plan of care, follow up appointments and return precautions with the patient and any family or support people present. They expressed understanding and agreed with the plan, discharged home.          ____________________________________________   FINAL CLINICAL IMPRESSION(S) / ED DIAGNOSES   Final diagnoses:  Chest pain, unspecified type     MEDICATIONS GIVEN DURING THIS VISIT:  Medications - No data to display   ED Discharge Orders     None        Note:  This document  was prepared using Conservation officer, historic buildings and may include unintentional dictation errors.    Concha Se, MD 07/20/20 1257

## 2020-07-20 NOTE — Discharge Instructions (Addendum)
Your COVID test is pending.  You can follow this up in MyChart.  Return to the ER if develop worsening symptoms including worsening or return of the numbness is not going away or any other concerns.

## 2021-02-16 ENCOUNTER — Other Ambulatory Visit: Payer: Self-pay | Admitting: Internal Medicine

## 2021-02-17 ENCOUNTER — Other Ambulatory Visit: Payer: Self-pay | Admitting: Internal Medicine

## 2021-02-17 DIAGNOSIS — N644 Mastodynia: Secondary | ICD-10-CM

## 2021-03-02 ENCOUNTER — Ambulatory Visit
Admission: RE | Admit: 2021-03-02 | Discharge: 2021-03-02 | Disposition: A | Discharge status: Home / Self Care | Payer: No Typology Code available for payment source | Source: Ambulatory Visit | Attending: Internal Medicine | Admitting: Internal Medicine

## 2021-03-02 ENCOUNTER — Other Ambulatory Visit: Payer: Self-pay

## 2021-03-02 DIAGNOSIS — N644 Mastodynia: Secondary | ICD-10-CM | POA: Diagnosis present

## 2022-01-06 ENCOUNTER — Emergency Department
Admission: EM | Admit: 2022-01-06 | Discharge: 2022-01-06 | Disposition: A | Discharge status: Home / Self Care | Payer: No Typology Code available for payment source | Attending: Emergency Medicine | Admitting: Emergency Medicine

## 2022-01-06 ENCOUNTER — Emergency Department: Payer: No Typology Code available for payment source

## 2022-01-06 ENCOUNTER — Telehealth: Payer: Self-pay | Admitting: Emergency Medicine

## 2022-01-06 ENCOUNTER — Other Ambulatory Visit: Payer: Self-pay

## 2022-01-06 DIAGNOSIS — B349 Viral infection, unspecified: Secondary | ICD-10-CM | POA: Insufficient documentation

## 2022-01-06 DIAGNOSIS — R0602 Shortness of breath: Secondary | ICD-10-CM | POA: Diagnosis present

## 2022-01-06 DIAGNOSIS — R6889 Other general symptoms and signs: Secondary | ICD-10-CM

## 2022-01-06 LAB — BASIC METABOLIC PANEL
Anion gap: 9 (ref 5–15)
BUN: 10 mg/dL (ref 6–20)
CO2: 24 mmol/L (ref 22–32)
Calcium: 9.1 mg/dL (ref 8.9–10.3)
Chloride: 107 mmol/L (ref 98–111)
Creatinine, Ser: 1.15 mg/dL — ABNORMAL HIGH (ref 0.44–1.00)
GFR, Estimated: >60 mL/min (ref >60)
Glucose, Bld: 107 mg/dL — ABNORMAL HIGH (ref 70–99)
Potassium: 3.5 mmol/L (ref 3.5–5.1)
Sodium: 140 mmol/L (ref 135–145)

## 2022-01-06 LAB — CBC
HCT: 39.9 % (ref 36.0–46.0)
Hemoglobin: 13 g/dL (ref 12.0–15.0)
MCH: 25.8 pg — ABNORMAL LOW (ref 26.0–34.0)
MCHC: 32.6 g/dL (ref 30.0–36.0)
MCV: 79.3 fL — ABNORMAL LOW (ref 80.0–100.0)
Platelets: 244 10*3/uL (ref 150–400)
RBC: 5.03 MIL/uL (ref 3.87–5.11)
RDW: 13.7 % (ref 11.5–15.5)
WBC: 3.6 10*3/uL — ABNORMAL LOW (ref 4.0–10.5)
nRBC: 0 % (ref 0.0–0.2)

## 2022-01-06 LAB — TROPONIN I (HIGH SENSITIVITY): Troponin I (High Sensitivity): <2 ng/L (ref <18)

## 2022-01-06 MED ORDER — ONDANSETRON 4 MG PO TBDP: 4.0000 mg | ORAL_TABLET | Freq: Three times a day (TID) | ORAL | 0 refills | Status: DC | PRN

## 2022-01-06 MED ORDER — ALBUTEROL SULFATE HFA 108 (90 BASE) MCG/ACT IN AERS: 2.0000 | INHALATION_SPRAY | Freq: Four times a day (QID) | RESPIRATORY_TRACT | 0 refills | Status: DC | PRN

## 2022-01-06 MED ORDER — LEVALBUTEROL TARTRATE 45 MCG/ACT IN AERO: 2.0000 | INHALATION_SPRAY | RESPIRATORY_TRACT | 2 refills | Status: AC | PRN

## 2022-01-06 NOTE — Telephone Encounter (Signed)
-----------------------------------------   3:28 PM on 01/06/22 ----------------------------------------- I received a call from patient's pharmacy stating that her insurance will not cover albuterol but will cover levalbuterol.  Prescription was changed to levalbuterol and resent to the pharmacy.

## 2022-01-06 NOTE — ED Triage Notes (Signed)
Pt ED via POV from home. Pt reports SOB and cough for a few days. Pt also states coworker has pneumonia and is concerned she caught something viral. Pt reports she is missing part of one of her lungs. Pt ambulatory without difficulty

## 2022-01-06 NOTE — ED Provider Notes (Signed)
[facility] Provider Note    Event Date/Time   First MD Initiated Contact with Patient 01/06/22 1016     (approximate)   History   Chief Complaint Shortness of Breath and Cough   HPI  Theresa Powell is a 41 y.o. female with past medical history of migraine, GERD, and spontaneous pneumothorax status post lobectomy who presents to the ED complaining of shortness of breath.  Patient reports that for the past 24 hours she has been dealing with a dry cough and some mild difficulty breathing.  She reports occasional soreness in her chest when she coughs, but pain is not as severe as when she dealt with a pneumothorax in the past.  She has had some subjective fevers and chills, reports a coworker recently told her that she was diagnosed with pneumonia.  Patient reports some nausea and vomiting, has not had any abdominal pain or diarrhea.  She also complains of diffuse bodyaches.     Physical Exam   Triage Vital Signs: ED Triage Vitals  Enc Vitals Group     BP 01/06/22 0924 (!) 148/102     Pulse Rate 01/06/22 0924 79     Resp 01/06/22 0924 16     Temp 01/06/22 0924 98.4 F (36.9 C)     Temp src --      SpO2 01/06/22 0924 98 %     Weight 01/06/22 0925 200 lb (90.7 kg)     Height 01/06/22 0925 5\' 6"  (1.676 m)     Head Circumference --      Peak Flow --      Pain Score --      Pain Loc --      Pain Edu? --      Excl. in GC? --     Most recent vital signs: Vitals:   01/06/22 0924  BP: (!) 148/102  Pulse: 79  Resp: 16  Temp: 98.4 F (36.9 C)  SpO2: 98%    Constitutional: Alert and oriented. Eyes: Conjunctivae are normal. Head: Atraumatic. Nose: No congestion/rhinnorhea. Mouth/Throat: Mucous membranes are moist.  Cardiovascular: Normal rate, regular rhythm. Grossly normal heart sounds.  2+ radial pulses bilaterally. Respiratory: Normal respiratory effort.  No retractions. Lungs CTAB. Gastrointestinal: Soft and nontender. No  distention. Musculoskeletal: No lower extremity tenderness nor edema.  Neurologic:  Normal speech and language. No gross focal neurologic deficits are appreciated.    ED Results / Procedures / Treatments   Labs (all labs ordered are listed, but only abnormal results are displayed) Labs Reviewed  BASIC METABOLIC PANEL - Abnormal; Notable for the following components:      Result Value   Glucose, Bld 107 (*)    Creatinine, Ser 1.15 (*)    All other components within normal limits  CBC - Abnormal; Notable for the following components:   WBC 3.6 (*)    MCV 79.3 (*)    MCH 25.8 (*)    All other components within normal limits  RESP PANEL BY RT-PCR (RSV, FLU A&B, COVID)  RVPGX2  POC URINE PREG, ED  TROPONIN I (HIGH SENSITIVITY)     EKG  ED ECG REPORT I, 01/08/22, the attending physician, personally viewed and interpreted this ECG.   Date: 01/06/2022  EKG Time: 9:23  Rate: 84  Rhythm: normal sinus rhythm  Axis: Normal  Intervals:none  ST&T Change: None  RADIOLOGY Chest x-ray reviewed and interpreted by me with no infiltrate, edema, or effusion.  PROCEDURES:  Critical Care performed:  No  Procedures   MEDICATIONS ORDERED IN ED: Medications - No data to display   IMPRESSION / MDM / ASSESSMENT AND PLAN / ED COURSE  I reviewed the triage vital signs and the nursing notes.                              41 y.o. female with past medical history of migraines, GERD, and spontaneous pneumothorax status post lobectomy who presents to the ED complaining of 24 hours of dry cough, mild difficulty breathing, and discomfort in her chest when she coughs.  Patient's presentation is most consistent with acute presentation with potential threat to life or bodily function.  Differential diagnosis includes, but is not limited to, ACS, PE, pneumonia, pneumothorax, bronchitis, influenza, COVID-19.  Patient well-appearing and in no acute distress, vital signs are unremarkable.   She is not in any respiratory distress and is maintaining oxygen saturations at 98% on room air.  Lungs are clear to auscultation bilaterally and chest x-ray is unremarkable with no evidence of pneumonia or pneumothorax.  EKG shows no evidence of arrhythmia or ischemia and troponin is negative, doubt ACS or PE given atypical symptoms.  Remainder of labs are reassuring with no significant anemia, leukocytosis, electrode abnormality, or AKI.  Influenza or other viral process seems most likely given her diffuse body aches, malaise, subjective fevers and chills.  She is appropriate for outpatient management and will be prescribed albuterol and Zofran for use as needed.  She was counseled to return to the ED for new or worsening symptoms, patient agrees with plan.      FINAL CLINICAL IMPRESSION(S) / ED DIAGNOSES   Final diagnoses:  Flu-like symptoms  Viral syndrome     Rx / DC Orders   ED Discharge Orders          Ordered    albuterol (VENTOLIN HFA) 108 (90 Base) MCG/ACT inhaler  Every 6 hours PRN       Note to Pharmacy: Please supply with spacer   01/06/22 1113    ondansetron (ZOFRAN-ODT) 4 MG disintegrating tablet  Every 8 hours PRN        01/06/22 1113             Note:  This document was prepared using Dragon voice recognition software and may include unintentional dictation errors.   Chesley Noon, MD 01/06/22 [phone]

## 2022-05-20 ENCOUNTER — Other Ambulatory Visit: Payer: Self-pay

## 2022-05-20 ENCOUNTER — Emergency Department
Admission: EM | Admit: 2022-05-20 | Discharge: 2022-05-21 | Disposition: A | Discharge status: Home / Self Care | Payer: No Typology Code available for payment source | Attending: Emergency Medicine | Admitting: Emergency Medicine

## 2022-05-20 DIAGNOSIS — R1010 Upper abdominal pain, unspecified: Secondary | ICD-10-CM | POA: Diagnosis present

## 2022-05-20 DIAGNOSIS — E860 Dehydration: Secondary | ICD-10-CM | POA: Diagnosis not present

## 2022-05-20 LAB — HEPATIC FUNCTION PANEL
ALT: 13 U/L (ref 0–44)
AST: 24 U/L (ref 15–41)
Albumin: 4.2 g/dL (ref 3.5–5.0)
Alkaline Phosphatase: 82 U/L (ref 38–126)
Bilirubin, Direct: 0.3 mg/dL — ABNORMAL HIGH (ref 0.0–0.2)
Indirect Bilirubin: 1 mg/dL — ABNORMAL HIGH (ref 0.3–0.9)
Total Bilirubin: 1.3 mg/dL — ABNORMAL HIGH (ref 0.3–1.2)
Total Protein: 8 g/dL (ref 6.5–8.1)

## 2022-05-20 LAB — CBC
HCT: 40.2 % (ref 36.0–46.0)
Hemoglobin: 13.1 g/dL (ref 12.0–15.0)
MCH: 25.7 pg — ABNORMAL LOW (ref 26.0–34.0)
MCHC: 32.6 g/dL (ref 30.0–36.0)
MCV: 78.8 fL — ABNORMAL LOW (ref 80.0–100.0)
Platelets: 293 10*3/uL (ref 150–400)
RBC: 5.1 MIL/uL (ref 3.87–5.11)
RDW: 13.2 % (ref 11.5–15.5)
WBC: 5.4 10*3/uL (ref 4.0–10.5)
nRBC: 0 % (ref 0.0–0.2)

## 2022-05-20 LAB — URINALYSIS, ROUTINE W REFLEX MICROSCOPIC
Bacteria, UA: NONE SEEN
Bilirubin Urine: NEGATIVE
Glucose, UA: NEGATIVE mg/dL
Ketones, ur: 80 mg/dL — AB
Leukocytes,Ua: NEGATIVE
Nitrite: NEGATIVE
Protein, ur: 30 mg/dL — AB
Specific Gravity, Urine: 1.03 (ref 1.005–1.030)
pH: 5 (ref 5.0–8.0)

## 2022-05-20 LAB — BASIC METABOLIC PANEL WITH GFR
Anion gap: 9 (ref 5–15)
BUN: 9 mg/dL (ref 6–20)
CO2: 23 mmol/L (ref 22–32)
Calcium: 8.5 mg/dL — ABNORMAL LOW (ref 8.9–10.3)
Chloride: 105 mmol/L (ref 98–111)
Creatinine, Ser: 0.99 mg/dL (ref 0.44–1.00)
GFR, Estimated: >60 mL/min (ref >60)
Glucose, Bld: 96 mg/dL (ref 70–99)
Potassium: 4 mmol/L (ref 3.5–5.1)
Sodium: 137 mmol/L (ref 135–145)

## 2022-05-20 LAB — PREGNANCY, URINE: Preg Test, Ur: NEGATIVE

## 2022-05-20 LAB — LIPASE, BLOOD: Lipase: 38 U/L (ref 11–51)

## 2022-05-20 LAB — TROPONIN I (HIGH SENSITIVITY): Troponin I (High Sensitivity): 3 ng/L (ref <18)

## 2022-05-20 MED ORDER — PANTOPRAZOLE SODIUM 40 MG IV SOLR
40.0000 mg | Freq: Once | INTRAVENOUS | Status: AC
  Administered 2022-05-20: 40 mg via INTRAVENOUS
  Filled 2022-05-20: qty 10

## 2022-05-20 MED ORDER — DEXTROSE IN LACTATED RINGERS 5 % IV SOLN
1000.0000 mL | Freq: Once | INTRAVENOUS | Status: AC
  Administered 2022-05-20: 1000 mL via INTRAVENOUS

## 2022-05-20 MED ORDER — SODIUM CHLORIDE 0.9 % IV BOLUS
500.0000 mL | Freq: Once | INTRAVENOUS | Status: AC
  Administered 2022-05-20: 500 mL via INTRAVENOUS

## 2022-05-20 MED ORDER — ALUMINUM-MAGNESIUM-SIMETHICONE 200-200-20 MG/5ML PO SUSP: 30.0000 mL | Freq: Three times a day (TID) | ORAL | 0 refills | Status: AC

## 2022-05-20 MED ORDER — ONDANSETRON 4 MG PO TBDP: 4.0000 mg | ORAL_TABLET | Freq: Three times a day (TID) | ORAL | 0 refills | Status: AC | PRN

## 2022-05-20 MED ORDER — KETOROLAC TROMETHAMINE 15 MG/ML IJ SOLN
15.0000 mg | Freq: Once | INTRAMUSCULAR | Status: AC
  Administered 2022-05-20: 15 mg via INTRAVENOUS
  Filled 2022-05-20: qty 1

## 2022-05-20 MED ORDER — DIPHENHYDRAMINE HCL 50 MG/ML IJ SOLN
25.0000 mg | INTRAMUSCULAR | Status: AC
  Administered 2022-05-20: 25 mg via INTRAVENOUS
  Filled 2022-05-20: qty 1

## 2022-05-20 MED ORDER — ONDANSETRON 4 MG PO TBDP
4.0000 mg | ORAL_TABLET | Freq: Once | ORAL | Status: AC
  Administered 2022-05-20: 4 mg via ORAL
  Filled 2022-05-20: qty 1

## 2022-05-20 MED ORDER — METOCLOPRAMIDE HCL 5 MG/ML IJ SOLN
10.0000 mg | INTRAMUSCULAR | Status: AC
  Administered 2022-05-20: 10 mg via INTRAVENOUS
  Filled 2022-05-20: qty 2

## 2022-05-20 MED ORDER — NAPROXEN 500 MG PO TABS: 500.0000 mg | ORAL_TABLET | Freq: Two times a day (BID) | ORAL | 0 refills | Status: AC

## 2022-05-20 NOTE — ED Provider Notes (Signed)
[facility] Provider Note    Event Date/Time   First MD Initiated Contact with Patient 05/20/22 2216     (approximate)   History   Chief Complaint: Weakness   HPI  Skila Angeletti is a 42 y.o. female with a history of GERD who comes the ED complaining of upper abdominal pain and vomiting for the past 3 days.  Patient reports being in her usual state of health until about a week ago when she started having nasal congestion and frontal headache which was gradual onset, throbbing, not thunderclap.  No vision changes paresthesias or motor weakness.  No fever but she does have chills and fatigue.  Denies diarrhea.     Physical Exam   Triage Vital Signs: ED Triage Vitals [05/20/22 2011]  Enc Vitals Group     BP (!) 155/115     Pulse Rate 69     Resp 19     Temp 98.9 F (37.2 C)     Temp Source Oral     SpO2 98 %     Weight 200 lb (90.7 kg)     Height 5\' 6"  (1.676 m)     Head Circumference      Peak Flow      Pain Score 10     Pain Loc      Pain Edu?      Excl. in GC?     Most recent vital signs: Vitals:   05/20/22 2011 05/20/22 2230  BP: (!) 155/115 (!) 140/97  Pulse: 69 71  Resp: 19 18  Temp: 98.9 F (37.2 C)   SpO2: 98% 100%    General: Awake, no distress.  Nontoxic CV:  Good peripheral perfusion.  Regular rate and rhythm Resp:  Normal effort.  Clear to auscultation bilaterally Abd:  No distention.  Soft with diffuse upper abdominal tenderness Other:  Dry mucous membranes.   ED Results / Procedures / Treatments   Labs (all labs ordered are listed, but only abnormal results are displayed) Labs Reviewed  BASIC METABOLIC PANEL - Abnormal; Notable for the following components:      Result Value   Calcium 8.5 (*)    All other components within normal limits  CBC - Abnormal; Notable for the following components:   MCV 78.8 (*)    MCH 25.7 (*)    All other components within normal limits  URINALYSIS, ROUTINE W REFLEX MICROSCOPIC -  Abnormal; Notable for the following components:   Color, Urine YELLOW (*)    APPearance HAZY (*)    Hgb urine dipstick SMALL (*)    Ketones, ur 80 (*)    Protein, ur 30 (*)    All other components within normal limits  HEPATIC FUNCTION PANEL - Abnormal; Notable for the following components:   Total Bilirubin 1.3 (*)    Bilirubin, Direct 0.3 (*)    Indirect Bilirubin 1.0 (*)    All other components within normal limits  PREGNANCY, URINE  LIPASE, BLOOD  CBG MONITORING, ED  TROPONIN I (HIGH SENSITIVITY)     EKG Interpreted by me Normal sinus rhythm rate of 69.  Normal axis intervals QRS ST segments and T waves   RADIOLOGY    PROCEDURES:  Procedures   MEDICATIONS ORDERED IN ED: Medications  sodium chloride 0.9 % bolus 500 mL (0 mLs Intravenous Stopped 05/20/22 2221)  ondansetron (ZOFRAN-ODT) disintegrating tablet 4 mg (4 mg Oral Given 05/20/22 2031)  ketorolac (TORADOL) 15 MG/ML injection 15 mg (15 mg  Intravenous Given 05/20/22 2241)  metoCLOPramide (REGLAN) injection 10 mg (10 mg Intravenous Given 05/20/22 2241)  pantoprazole (PROTONIX) injection 40 mg (40 mg Intravenous Given 05/20/22 2242)  diphenhydrAMINE (BENADRYL) injection 25 mg (25 mg Intravenous Given 05/20/22 2241)  dextrose 5 % in lactated ringers infusion (1,000 mLs Intravenous New Bag/Given 05/20/22 2239)     IMPRESSION / MDM / ASSESSMENT AND PLAN / ED COURSE  I reviewed the triage vital signs and the nursing notes.  DDx: Viral illness, pancreatitis, gastritis, dehydration, AKI, electrolyte abnormality  Patient's presentation is most consistent with acute presentation with potential threat to life or bodily function.  Patient presents with constitutional symptoms upper abdominal pain and vomiting, most likely viral gastritis.  Vitals and labs are all unremarkable.  Clinically she does appear dehydrated.  Will give IV fluids and medications for GI symptoms and migraine cocktail and reassess.       FINAL CLINICAL  IMPRESSION(S) / ED DIAGNOSES   Final diagnoses:  Dehydration  Upper abdominal pain     Rx / DC Orders   ED Discharge Orders          Ordered    ondansetron (ZOFRAN-ODT) 4 MG disintegrating tablet  Every 8 hours PRN        05/20/22 2311    aluminum-magnesium hydroxide-simethicone (MAALOX) 200-200-20 MG/5ML SUSP  3 times daily before meals & bedtime        05/20/22 2311    naproxen (NAPROSYN) 500 MG tablet  2 times daily with meals        05/20/22 2311             Note:  This document was prepared using Dragon voice recognition software and may include unintentional dictation errors.   Sharman Cheek, MD 05/20/22 [phone]

## 2022-05-20 NOTE — ED Triage Notes (Signed)
Pt arrives via POV with CC of weakness. Pt symptoms started with congestion one week ago (resolved) and pt has been throwing up for the last three days. Pt also reports headache that is constant and rated 10/10.

## 2022-05-20 NOTE — ED Notes (Signed)
Lab called to add troponin to standing labs.

## 2022-05-21 NOTE — ED Notes (Signed)
Pt A&O x4, no obvious distress noted, respirations regular/unlabored. Pt verbalizes understanding of discharge instructions. Pt able to ambulate from ED independently.   

## 2022-05-21 NOTE — ED Provider Notes (Signed)
Patient feels much better after medications given in the emergency department, remainder of labs unremarkable.  Plan for discharge with PMD follow-up and return precautions given.   Pilar Jarvis, MD 05/21/22 Jorje Guild

## 2023-01-01 ENCOUNTER — Other Ambulatory Visit: Payer: Self-pay | Admitting: Internal Medicine

## 2023-01-01 DIAGNOSIS — Z1231 Encounter for screening mammogram for malignant neoplasm of breast: Secondary | ICD-10-CM

## 2023-01-15 ENCOUNTER — Other Ambulatory Visit: Payer: Self-pay | Admitting: Internal Medicine

## 2023-01-15 DIAGNOSIS — N289 Disorder of kidney and ureter, unspecified: Secondary | ICD-10-CM

## 2023-01-18 ENCOUNTER — Ambulatory Visit
Admission: RE | Admit: 2023-01-18 | Discharge: 2023-01-18 | Disposition: A | Discharge status: Home / Self Care | Payer: No Typology Code available for payment source | Source: Ambulatory Visit | Attending: Internal Medicine | Admitting: Internal Medicine

## 2023-01-18 DIAGNOSIS — N289 Disorder of kidney and ureter, unspecified: Secondary | ICD-10-CM | POA: Diagnosis present

## 2023-02-12 ENCOUNTER — Ambulatory Visit
Admission: RE | Admit: 2023-02-12 | Discharge: 2023-02-12 | Disposition: A | Discharge status: Home / Self Care | Payer: No Typology Code available for payment source | Source: Ambulatory Visit | Attending: Internal Medicine | Admitting: Internal Medicine

## 2023-02-12 DIAGNOSIS — Z1231 Encounter for screening mammogram for malignant neoplasm of breast: Secondary | ICD-10-CM | POA: Diagnosis present
